# Patient Record
Sex: Female | Born: 1935 | Race: White | Hispanic: No | Marital: Married | Smoking: Never smoker
Health system: Southern US, Community
[De-identification: ages and names within clinical notes are randomized; demographics above are authoritative.]

## PROBLEM LIST (undated history)

## (undated) DIAGNOSIS — E7800 Pure hypercholesterolemia, unspecified: Secondary | ICD-10-CM

## (undated) DIAGNOSIS — D591 Other autoimmune hemolytic anemias: Principal | ICD-10-CM

## (undated) DIAGNOSIS — D649 Anemia, unspecified: Secondary | ICD-10-CM

## (undated) DIAGNOSIS — F0390 Unspecified dementia without behavioral disturbance: Secondary | ICD-10-CM

## (undated) DIAGNOSIS — D4620 Refractory anemia with excess of blasts, unspecified: Principal | ICD-10-CM

## (undated) DIAGNOSIS — S72001A Fracture of unspecified part of neck of right femur, initial encounter for closed fracture: Secondary | ICD-10-CM

## (undated) DIAGNOSIS — M1990 Unspecified osteoarthritis, unspecified site: Secondary | ICD-10-CM

## (undated) DIAGNOSIS — J189 Pneumonia, unspecified organism: Secondary | ICD-10-CM

## (undated) DIAGNOSIS — Z8489 Family history of other specified conditions: Secondary | ICD-10-CM

## (undated) DIAGNOSIS — R011 Cardiac murmur, unspecified: Secondary | ICD-10-CM

## (undated) DIAGNOSIS — R112 Nausea with vomiting, unspecified: Secondary | ICD-10-CM

## (undated) DIAGNOSIS — C44300 Unspecified malignant neoplasm of skin of unspecified part of face: Secondary | ICD-10-CM

## (undated) DIAGNOSIS — Z9889 Other specified postprocedural states: Secondary | ICD-10-CM

## (undated) DIAGNOSIS — Z9289 Personal history of other medical treatment: Secondary | ICD-10-CM

## (undated) DIAGNOSIS — N200 Calculus of kidney: Secondary | ICD-10-CM

## (undated) HISTORY — DX: Other autoimmune hemolytic anemias: D59.1

## (undated) HISTORY — DX: Refractory anemia with excess of blasts, unspecified: D46.20

## (undated) HISTORY — PX: VAGINAL HYSTERECTOMY: SUR661

## (undated) HISTORY — PX: KIDNEY STONE SURGERY: SHX686

## (undated) HISTORY — PX: TONSILLECTOMY: SUR1361

---

## 1998-05-24 ENCOUNTER — Other Ambulatory Visit: Admission: RE | Admit: 1998-05-24 | Discharge: 1998-05-24 | Payer: Self-pay | Admitting: *Deleted

## 1999-05-22 ENCOUNTER — Other Ambulatory Visit: Admission: RE | Admit: 1999-05-22 | Discharge: 1999-05-22 | Payer: Self-pay | Admitting: *Deleted

## 2000-04-09 ENCOUNTER — Other Ambulatory Visit: Admission: RE | Admit: 2000-04-09 | Discharge: 2000-04-09 | Payer: Self-pay | Admitting: *Deleted

## 2001-06-27 ENCOUNTER — Other Ambulatory Visit: Admission: RE | Admit: 2001-06-27 | Discharge: 2001-06-27 | Payer: Self-pay | Admitting: *Deleted

## 2001-08-04 ENCOUNTER — Encounter: Payer: Self-pay | Admitting: *Deleted

## 2001-08-04 ENCOUNTER — Ambulatory Visit (HOSPITAL_COMMUNITY): Admission: RE | Admit: 2001-08-04 | Discharge: 2001-08-04 | Payer: Self-pay | Admitting: *Deleted

## 2002-08-07 ENCOUNTER — Encounter: Admission: RE | Admit: 2002-08-07 | Discharge: 2002-08-07 | Payer: Self-pay

## 2002-08-08 ENCOUNTER — Ambulatory Visit (HOSPITAL_COMMUNITY): Admission: RE | Admit: 2002-08-08 | Discharge: 2002-08-08 | Payer: Self-pay | Admitting: *Deleted

## 2002-08-08 ENCOUNTER — Encounter: Payer: Self-pay | Admitting: Family Medicine

## 2003-05-11 ENCOUNTER — Ambulatory Visit: Admission: RE | Admit: 2003-05-11 | Discharge: 2003-05-11 | Payer: Self-pay | Admitting: Family Medicine

## 2003-05-11 ENCOUNTER — Encounter: Payer: Self-pay | Admitting: Cardiology

## 2003-09-03 ENCOUNTER — Ambulatory Visit (HOSPITAL_COMMUNITY): Admission: RE | Admit: 2003-09-03 | Discharge: 2003-09-03 | Payer: Self-pay | Admitting: *Deleted

## 2003-09-03 ENCOUNTER — Encounter: Payer: Self-pay | Admitting: *Deleted

## 2004-05-20 ENCOUNTER — Other Ambulatory Visit: Admission: RE | Admit: 2004-05-20 | Discharge: 2004-05-20 | Payer: Self-pay | Admitting: *Deleted

## 2004-09-03 ENCOUNTER — Ambulatory Visit (HOSPITAL_COMMUNITY): Admission: RE | Admit: 2004-09-03 | Discharge: 2004-09-03 | Payer: Self-pay | Admitting: Internal Medicine

## 2005-09-10 ENCOUNTER — Ambulatory Visit (HOSPITAL_COMMUNITY): Admission: RE | Admit: 2005-09-10 | Discharge: 2005-09-10 | Payer: Self-pay | Admitting: Family Medicine

## 2006-06-17 ENCOUNTER — Other Ambulatory Visit: Admission: RE | Admit: 2006-06-17 | Discharge: 2006-06-17 | Payer: Self-pay | Admitting: *Deleted

## 2006-11-18 ENCOUNTER — Ambulatory Visit (HOSPITAL_COMMUNITY): Admission: RE | Admit: 2006-11-18 | Discharge: 2006-11-18 | Payer: Self-pay | Admitting: Family Medicine

## 2006-11-30 ENCOUNTER — Encounter: Admission: RE | Admit: 2006-11-30 | Discharge: 2006-11-30 | Payer: Self-pay | Admitting: Family Medicine

## 2007-07-20 ENCOUNTER — Other Ambulatory Visit: Admission: RE | Admit: 2007-07-20 | Discharge: 2007-07-20 | Payer: Self-pay | Admitting: *Deleted

## 2007-08-10 ENCOUNTER — Ambulatory Visit: Payer: Self-pay | Admitting: Gastroenterology

## 2007-08-18 ENCOUNTER — Encounter: Payer: Self-pay | Admitting: Gastroenterology

## 2007-08-18 ENCOUNTER — Ambulatory Visit: Payer: Self-pay | Admitting: Gastroenterology

## 2007-09-08 ENCOUNTER — Ambulatory Visit (HOSPITAL_COMMUNITY): Admission: RE | Admit: 2007-09-08 | Discharge: 2007-09-08 | Payer: Self-pay | Admitting: Gastroenterology

## 2007-09-16 ENCOUNTER — Ambulatory Visit: Payer: Self-pay | Admitting: Gastroenterology

## 2008-01-23 ENCOUNTER — Encounter: Admission: RE | Admit: 2008-01-23 | Discharge: 2008-01-23 | Payer: Self-pay | Admitting: Family Medicine

## 2008-07-26 ENCOUNTER — Other Ambulatory Visit: Admission: RE | Admit: 2008-07-26 | Discharge: 2008-07-26 | Payer: Self-pay | Admitting: Gynecology

## 2008-09-10 ENCOUNTER — Telehealth: Payer: Self-pay | Admitting: Gastroenterology

## 2010-06-02 ENCOUNTER — Ambulatory Visit: Payer: Self-pay | Admitting: Hematology & Oncology

## 2010-06-06 ENCOUNTER — Ambulatory Visit: Payer: Self-pay | Admitting: Internal Medicine

## 2010-06-06 LAB — RETICULOCYTES (CHCC): ABS Retic: 103.6 10*3/uL (ref 19.0–186.0)

## 2010-06-06 LAB — CBC WITH DIFFERENTIAL (CANCER CENTER ONLY)
BASO#: 0 10*3/uL (ref 0.0–0.2)
BASO%: 0.2 % (ref 0.0–2.0)
HCT: 26.7 % — ABNORMAL LOW (ref 34.8–46.6)
HGB: 9.2 g/dL — ABNORMAL LOW (ref 11.6–15.9)
LYMPH#: 0.7 10*3/uL — ABNORMAL LOW (ref 0.9–3.3)
MONO#: 0.2 10*3/uL (ref 0.1–0.9)
NEUT%: 78.5 % (ref 39.6–80.0)
WBC: 4.5 10*3/uL (ref 3.9–10.0)

## 2010-06-10 LAB — COMPREHENSIVE METABOLIC PANEL
ALT: 10 U/L (ref 0–35)
BUN: 14 mg/dL (ref 6–23)
CO2: 24 mEq/L (ref 19–32)
Calcium: 9.2 mg/dL (ref 8.4–10.5)
Chloride: 106 mEq/L (ref 96–112)
Creatinine, Ser: 0.54 mg/dL (ref 0.40–1.20)

## 2010-06-10 LAB — PROTEIN ELECTROPHORESIS, SERUM
Albumin ELP: 61.2 % (ref 55.8–66.1)
Alpha-1-Globulin: 3.9 % (ref 2.9–4.9)
Beta 2: 4.6 % (ref 3.2–6.5)
Beta Globulin: 5.2 % (ref 4.7–7.2)

## 2010-06-10 LAB — VITAMIN B12: Vitamin B-12: 1779 pg/mL — ABNORMAL HIGH (ref 211–911)

## 2010-06-10 LAB — ERYTHROPOIETIN: Erythropoietin: 67.3 m[IU]/mL — ABNORMAL HIGH (ref 2.6–34.0)

## 2010-06-10 LAB — LACTATE DEHYDROGENASE: LDH: 198 U/L (ref 94–250)

## 2010-06-10 LAB — IRON AND TIBC
%SAT: 18 % — ABNORMAL LOW (ref 20–55)
TIBC: 288 ug/dL (ref 250–470)

## 2010-07-08 ENCOUNTER — Ambulatory Visit: Payer: Self-pay | Admitting: Hematology & Oncology

## 2010-07-09 LAB — CBC WITH DIFFERENTIAL (CANCER CENTER ONLY)
Eosinophils Absolute: 0.1 10*3/uL (ref 0.0–0.5)
HCT: 27.4 % — ABNORMAL LOW (ref 34.8–46.6)
LYMPH%: 11.4 % — ABNORMAL LOW (ref 14.0–48.0)
MCV: 93 fL (ref 81–101)
MONO#: 0.3 10*3/uL (ref 0.1–0.9)
NEUT%: 80.5 % — ABNORMAL HIGH (ref 39.6–80.0)
RBC: 2.94 10*6/uL — ABNORMAL LOW (ref 3.70–5.32)
RDW: 12 % (ref 10.5–14.6)
WBC: 5.1 10*3/uL (ref 3.9–10.0)

## 2010-07-09 LAB — RETICULOCYTES (CHCC): ABS Retic: 117.3 10*3/uL (ref 19.0–186.0)

## 2010-08-18 ENCOUNTER — Ambulatory Visit: Payer: Self-pay | Admitting: Hematology & Oncology

## 2010-08-20 LAB — CBC WITH DIFFERENTIAL (CANCER CENTER ONLY)
BASO%: 0.4 % (ref 0.0–2.0)
LYMPH%: 14.1 % (ref 14.0–48.0)
MCH: 31.5 pg (ref 26.0–34.0)
MCV: 93 fL (ref 81–101)
MONO%: 5.8 % (ref 0.0–13.0)
Platelets: 421 10*3/uL — ABNORMAL HIGH (ref 145–400)
RDW: 12.3 % (ref 10.5–14.6)

## 2010-08-20 LAB — IRON AND TIBC: UIBC: 256 ug/dL

## 2010-08-20 LAB — RETICULOCYTES (CHCC)
RBC.: 3.01 MIL/uL — ABNORMAL LOW (ref 3.87–5.11)
Retic Ct Pct: 3.2 % — ABNORMAL HIGH (ref 0.4–3.1)

## 2010-08-20 LAB — CHCC SATELLITE - SMEAR

## 2010-09-10 LAB — CBC WITH DIFFERENTIAL (CANCER CENTER ONLY)
BASO#: 0 10*3/uL (ref 0.0–0.2)
Eosinophils Absolute: 0.1 10*3/uL (ref 0.0–0.5)
HGB: 9.8 g/dL — ABNORMAL LOW (ref 11.6–15.9)
LYMPH%: 13.9 % — ABNORMAL LOW (ref 14.0–48.0)
MCV: 96 fL (ref 81–101)
MONO#: 0.2 10*3/uL (ref 0.1–0.9)
Platelets: 403 10*3/uL — ABNORMAL HIGH (ref 145–400)
RBC: 3.07 10*6/uL — ABNORMAL LOW (ref 3.70–5.32)
WBC: 4.8 10*3/uL (ref 3.9–10.0)

## 2010-09-30 ENCOUNTER — Ambulatory Visit: Payer: Self-pay | Admitting: Hematology & Oncology

## 2010-10-01 LAB — CBC WITH DIFFERENTIAL (CANCER CENTER ONLY)
BASO#: 0 10*3/uL (ref 0.0–0.2)
EOS%: 2.3 % (ref 0.0–7.0)
HCT: 30 % — ABNORMAL LOW (ref 34.8–46.6)
HGB: 10.2 g/dL — ABNORMAL LOW (ref 11.6–15.9)
LYMPH%: 14.7 % (ref 14.0–48.0)
MCH: 32.6 pg (ref 26.0–34.0)
MCHC: 34 g/dL (ref 32.0–36.0)
MONO%: 5.5 % (ref 0.0–13.0)
NEUT%: 77.1 % (ref 39.6–80.0)

## 2010-10-22 LAB — CBC WITH DIFFERENTIAL (CANCER CENTER ONLY)
BASO#: 0 10*3/uL (ref 0.0–0.2)
EOS%: 1.9 % (ref 0.0–7.0)
HCT: 31.9 % — ABNORMAL LOW (ref 34.8–46.6)
HGB: 10.7 g/dL — ABNORMAL LOW (ref 11.6–15.9)
LYMPH#: 0.6 10*3/uL — ABNORMAL LOW (ref 0.9–3.3)
MCHC: 33.7 g/dL (ref 32.0–36.0)
NEUT%: 79.6 % (ref 39.6–80.0)

## 2010-10-22 LAB — IRON AND TIBC
Iron: 41 ug/dL — ABNORMAL LOW (ref 42–145)
TIBC: 289 ug/dL (ref 250–470)
UIBC: 248 ug/dL

## 2010-10-22 LAB — RETICULOCYTES (CHCC): ABS Retic: 53.4 10*3/uL (ref 19.0–186.0)

## 2010-10-22 LAB — FERRITIN: Ferritin: 675 ng/mL — ABNORMAL HIGH (ref 10–291)

## 2010-11-11 ENCOUNTER — Ambulatory Visit: Payer: Self-pay | Admitting: Hematology & Oncology

## 2010-11-12 LAB — CBC WITH DIFFERENTIAL (CANCER CENTER ONLY)
BASO#: 0 10*3/uL (ref 0.0–0.2)
HCT: 31.1 % — ABNORMAL LOW (ref 34.8–46.6)
HGB: 10.6 g/dL — ABNORMAL LOW (ref 11.6–15.9)
LYMPH#: 0.7 10*3/uL — ABNORMAL LOW (ref 0.9–3.3)
LYMPH%: 16.5 % (ref 14.0–48.0)
MCHC: 34 g/dL (ref 32.0–36.0)
MCV: 94 fL (ref 81–101)
MONO#: 0.2 10*3/uL (ref 0.1–0.9)
NEUT%: 75.9 % (ref 39.6–80.0)
WBC: 4.3 10*3/uL (ref 3.9–10.0)

## 2010-12-03 LAB — CBC WITH DIFFERENTIAL (CANCER CENTER ONLY)
BASO#: 0 10*3/uL (ref 0.0–0.2)
BASO%: 0.5 % (ref 0.0–2.0)
EOS%: 1.9 % (ref 0.0–7.0)
Eosinophils Absolute: 0.1 10*3/uL (ref 0.0–0.5)
HCT: 30.7 % — ABNORMAL LOW (ref 34.8–46.6)
HGB: 10.5 g/dL — ABNORMAL LOW (ref 11.6–15.9)
LYMPH#: 0.7 10*3/uL — ABNORMAL LOW (ref 0.9–3.3)
LYMPH%: 15.7 % (ref 14.0–48.0)
MCH: 32.3 pg (ref 26.0–34.0)
MCHC: 34.3 g/dL (ref 32.0–36.0)
MCV: 94 fL (ref 81–101)
MONO#: 0.2 10*3/uL (ref 0.1–0.9)
MONO%: 5.5 % (ref 0.0–13.0)
NEUT#: 3.3 10*3/uL (ref 1.5–6.5)
NEUT%: 76.4 % (ref 39.6–80.0)
Platelets: 352 10*3/uL (ref 145–400)
RBC: 3.26 10*6/uL — ABNORMAL LOW (ref 3.70–5.32)
RDW: 12.3 % (ref 10.5–14.6)
WBC: 4.3 10*3/uL (ref 3.9–10.0)

## 2010-12-03 LAB — CHCC SATELLITE - SMEAR

## 2010-12-03 LAB — IRON AND TIBC
%SAT: 12 % — ABNORMAL LOW (ref 20–55)
Iron: 33 ug/dL — ABNORMAL LOW (ref 42–145)
TIBC: 286 ug/dL (ref 250–470)
UIBC: 253 ug/dL

## 2010-12-03 LAB — RETICULOCYTES (CHCC)
ABS Retic: 72.7 10*3/uL (ref 19.0–186.0)
RBC.: 3.16 MIL/uL — ABNORMAL LOW (ref 3.87–5.11)
Retic Ct Pct: 2.3 % (ref 0.4–3.1)

## 2010-12-03 LAB — FERRITIN: Ferritin: 617 ng/mL — ABNORMAL HIGH (ref 10–291)

## 2010-12-07 ENCOUNTER — Encounter: Payer: Self-pay | Admitting: Family Medicine

## 2010-12-11 ENCOUNTER — Ambulatory Visit: Payer: Self-pay | Admitting: Hematology & Oncology

## 2011-01-07 ENCOUNTER — Other Ambulatory Visit: Payer: Self-pay | Admitting: Hematology & Oncology

## 2011-01-07 ENCOUNTER — Encounter (HOSPITAL_BASED_OUTPATIENT_CLINIC_OR_DEPARTMENT_OTHER): Payer: Medicare Other | Admitting: Hematology & Oncology

## 2011-01-07 LAB — CBC WITH DIFFERENTIAL (CANCER CENTER ONLY)
BASO#: 0 10*3/uL (ref 0.0–0.2)
EOS%: 1.3 % (ref 0.0–7.0)
MCH: 32.8 pg (ref 26.0–34.0)
MCHC: 35 g/dL (ref 32.0–36.0)
MONO%: 5.1 % (ref 0.0–13.0)
NEUT#: 3.9 10*3/uL (ref 1.5–6.5)
Platelets: 355 10*3/uL (ref 145–400)
RDW: 11.6 % (ref 10.5–14.6)

## 2011-01-07 LAB — RETICULOCYTES (CHCC): Retic Ct Pct: 2.2 % (ref 0.4–3.1)

## 2011-01-07 LAB — CHCC SATELLITE - SMEAR

## 2011-01-07 LAB — IRON AND TIBC
%SAT: 18 % — ABNORMAL LOW (ref 20–55)
Iron: 51 ug/dL (ref 42–145)

## 2011-02-04 ENCOUNTER — Other Ambulatory Visit: Payer: Self-pay | Admitting: Hematology & Oncology

## 2011-02-04 ENCOUNTER — Encounter (HOSPITAL_BASED_OUTPATIENT_CLINIC_OR_DEPARTMENT_OTHER): Payer: Medicare Other | Admitting: Hematology & Oncology

## 2011-02-04 DIAGNOSIS — D4620 Refractory anemia with excess of blasts, unspecified: Secondary | ICD-10-CM

## 2011-02-04 LAB — CBC WITH DIFFERENTIAL (CANCER CENTER ONLY)
BASO#: 0.1 10*3/uL (ref 0.0–0.2)
EOS%: 1.3 % (ref 0.0–7.0)
Eosinophils Absolute: 0.1 10*3/uL (ref 0.0–0.5)
HGB: 9.9 g/dL — ABNORMAL LOW (ref 11.6–15.9)
LYMPH%: 16 % (ref 14.0–48.0)
MCH: 34.9 pg — ABNORMAL HIGH (ref 26.0–34.0)
MCHC: 34.9 g/dL (ref 32.0–36.0)
MCV: 100 fL (ref 81–101)
MONO%: 7.1 % (ref 0.0–13.0)
NEUT#: 3.4 10*3/uL (ref 1.5–6.5)
NEUT%: 74.1 % (ref 39.6–80.0)

## 2011-02-04 LAB — IRON AND TIBC
Iron: 49 ug/dL (ref 42–145)
UIBC: 216 ug/dL

## 2011-02-04 LAB — FERRITIN: Ferritin: 809 ng/mL — ABNORMAL HIGH (ref 10–291)

## 2011-02-04 LAB — VITAMIN B12: Vitamin B-12: 198 pg/mL — ABNORMAL LOW (ref 211–911)

## 2011-02-04 LAB — RETICULOCYTES (CHCC): RBC.: 2.79 MIL/uL — ABNORMAL LOW (ref 3.87–5.11)

## 2011-03-04 ENCOUNTER — Other Ambulatory Visit: Payer: Self-pay | Admitting: Hematology & Oncology

## 2011-03-04 ENCOUNTER — Encounter (HOSPITAL_BASED_OUTPATIENT_CLINIC_OR_DEPARTMENT_OTHER): Payer: Medicare Other | Admitting: Hematology & Oncology

## 2011-03-04 DIAGNOSIS — D4620 Refractory anemia with excess of blasts, unspecified: Secondary | ICD-10-CM

## 2011-03-04 LAB — CBC WITH DIFFERENTIAL (CANCER CENTER ONLY)
EOS%: 1.3 % (ref 0.0–7.0)
LYMPH%: 12.9 % — ABNORMAL LOW (ref 14.0–48.0)
MCH: 36.2 pg — ABNORMAL HIGH (ref 26.0–34.0)
MCHC: 35 g/dL (ref 32.0–36.0)
MCV: 103 fL — ABNORMAL HIGH (ref 81–101)
MONO%: 7.2 % (ref 0.0–13.0)
NEUT#: 2.9 10*3/uL (ref 1.5–6.5)
Platelets: 304 10*3/uL (ref 145–400)
RBC: 2.68 10*6/uL — ABNORMAL LOW (ref 3.70–5.32)
RDW: 18.5 % — ABNORMAL HIGH (ref 11.1–15.7)

## 2011-03-04 LAB — CHCC SATELLITE - SMEAR

## 2011-03-04 LAB — IRON AND TIBC
%SAT: 13 % — ABNORMAL LOW (ref 20–55)
TIBC: 260 ug/dL (ref 250–470)

## 2011-03-04 LAB — RETICULOCYTES (CHCC): ABS Retic: 72.3 10*3/uL (ref 19.0–186.0)

## 2011-03-04 LAB — FERRITIN: Ferritin: 812 ng/mL — ABNORMAL HIGH (ref 10–291)

## 2011-03-30 ENCOUNTER — Encounter (HOSPITAL_BASED_OUTPATIENT_CLINIC_OR_DEPARTMENT_OTHER): Payer: Medicare Other | Admitting: Hematology & Oncology

## 2011-03-30 ENCOUNTER — Other Ambulatory Visit: Payer: Self-pay | Admitting: Hematology & Oncology

## 2011-03-30 DIAGNOSIS — D4620 Refractory anemia with excess of blasts, unspecified: Secondary | ICD-10-CM

## 2011-03-30 LAB — RETICULOCYTES (CHCC)
ABS Retic: 126.3 10*3/uL (ref 19.0–186.0)
RBC.: 2.87 MIL/uL — ABNORMAL LOW (ref 3.87–5.11)
Retic Ct Pct: 4.4 % — ABNORMAL HIGH (ref 0.4–3.1)

## 2011-03-30 LAB — CHCC SATELLITE - SMEAR

## 2011-03-30 LAB — CBC WITH DIFFERENTIAL (CANCER CENTER ONLY)
BASO#: 0 10*3/uL (ref 0.0–0.2)
Eosinophils Absolute: 0.1 10*3/uL (ref 0.0–0.5)
HGB: 10 g/dL — ABNORMAL LOW (ref 11.6–15.9)
LYMPH#: 0.5 10*3/uL — ABNORMAL LOW (ref 0.9–3.3)
MONO%: 5.1 % (ref 0.0–13.0)
NEUT#: 4.4 10*3/uL (ref 1.5–6.5)
Platelets: 316 10*3/uL (ref 145–400)
RBC: 2.74 10*6/uL — ABNORMAL LOW (ref 3.70–5.32)
WBC: 5.3 10*3/uL (ref 3.9–10.0)

## 2011-03-30 LAB — VITAMIN B12: Vitamin B-12: 163 pg/mL — ABNORMAL LOW (ref 211–911)

## 2011-06-12 ENCOUNTER — Other Ambulatory Visit: Payer: Self-pay | Admitting: Hematology & Oncology

## 2011-06-12 ENCOUNTER — Encounter (HOSPITAL_BASED_OUTPATIENT_CLINIC_OR_DEPARTMENT_OTHER): Payer: Medicare Other | Admitting: Hematology & Oncology

## 2011-06-12 DIAGNOSIS — D4620 Refractory anemia with excess of blasts, unspecified: Secondary | ICD-10-CM

## 2011-06-12 DIAGNOSIS — D509 Iron deficiency anemia, unspecified: Secondary | ICD-10-CM

## 2011-06-12 LAB — CBC WITH DIFFERENTIAL (CANCER CENTER ONLY)
BASO#: 0 10*3/uL (ref 0.0–0.2)
HCT: 27.2 % — ABNORMAL LOW (ref 34.8–46.6)
HGB: 10 g/dL — ABNORMAL LOW (ref 11.6–15.9)
LYMPH#: 0.5 10*3/uL — ABNORMAL LOW (ref 0.9–3.3)
MONO#: 0.3 10*3/uL (ref 0.1–0.9)
NEUT#: 3.3 10*3/uL (ref 1.5–6.5)
NEUT%: 79.1 % (ref 39.6–80.0)
RBC: 2.58 10*6/uL — ABNORMAL LOW (ref 3.70–5.32)
WBC: 4.2 10*3/uL (ref 3.9–10.0)

## 2011-06-12 LAB — IRON AND TIBC
%SAT: 15 % — ABNORMAL LOW (ref 20–55)
TIBC: 278 ug/dL (ref 250–470)
UIBC: 236 ug/dL

## 2011-06-12 LAB — RETICULOCYTES (CHCC)
RBC.: 2.92 MIL/uL — ABNORMAL LOW (ref 3.87–5.11)
Retic Ct Pct: 4.1 % — ABNORMAL HIGH (ref 0.4–2.3)

## 2011-10-06 ENCOUNTER — Telehealth: Payer: Self-pay | Admitting: *Deleted

## 2011-10-06 NOTE — Telephone Encounter (Signed)
Left pt message to call and schedule appointment was referred for follow up from Dr. Jeannetta Nap

## 2011-10-06 NOTE — Telephone Encounter (Signed)
Nettie Elm called from Dr. Jeannetta Nap wants [place] to schedule pt she is aware

## 2011-10-07 ENCOUNTER — Other Ambulatory Visit: Payer: Self-pay

## 2011-10-07 ENCOUNTER — Telehealth: Payer: Self-pay | Admitting: *Deleted

## 2011-10-07 NOTE — Telephone Encounter (Signed)
Pt made 11-23 appointment °

## 2011-10-09 ENCOUNTER — Ambulatory Visit (HOSPITAL_BASED_OUTPATIENT_CLINIC_OR_DEPARTMENT_OTHER): Payer: Medicare Other | Admitting: Hematology & Oncology

## 2011-10-09 ENCOUNTER — Other Ambulatory Visit: Payer: Self-pay | Admitting: Hematology & Oncology

## 2011-10-09 ENCOUNTER — Encounter: Payer: Self-pay | Admitting: Hematology & Oncology

## 2011-10-09 ENCOUNTER — Other Ambulatory Visit: Payer: Self-pay | Admitting: Oncology

## 2011-10-09 ENCOUNTER — Other Ambulatory Visit (HOSPITAL_BASED_OUTPATIENT_CLINIC_OR_DEPARTMENT_OTHER): Payer: Medicare Other | Admitting: Lab

## 2011-10-09 VITALS — BP 117/73 | HR 77 | Temp 96.6°F | Ht 62.75 in | Wt 117.0 lb

## 2011-10-09 DIAGNOSIS — D509 Iron deficiency anemia, unspecified: Secondary | ICD-10-CM

## 2011-10-09 DIAGNOSIS — D46Z Other myelodysplastic syndromes: Secondary | ICD-10-CM | POA: Insufficient documentation

## 2011-10-09 DIAGNOSIS — D539 Nutritional anemia, unspecified: Secondary | ICD-10-CM

## 2011-10-09 DIAGNOSIS — D4620 Refractory anemia with excess of blasts, unspecified: Secondary | ICD-10-CM

## 2011-10-09 HISTORY — DX: Refractory anemia with excess of blasts, unspecified: D46.20

## 2011-10-09 HISTORY — DX: Other myelodysplastic syndromes: D46.Z

## 2011-10-09 LAB — RETICULOCYTES (CHCC)
ABS Retic: 182.5 10*3/uL (ref 19.0–186.0)
ABS Retic: 182.5 10*3/uL (ref 19.0–186.0)
RBC.: 2.57 MIL/uL — ABNORMAL LOW (ref 3.87–5.11)

## 2011-10-09 LAB — IRON AND TIBC
%SAT: 18 % — ABNORMAL LOW (ref 20–55)
Iron: 53 ug/dL (ref 42–145)
UIBC: 243 ug/dL (ref 125–400)

## 2011-10-09 LAB — COMPREHENSIVE METABOLIC PANEL
Alkaline Phosphatase: 79 U/L (ref 39–117)
BUN: 15 mg/dL (ref 6–23)
CO2: 25 mEq/L (ref 19–32)
Glucose, Bld: 116 mg/dL — ABNORMAL HIGH (ref 70–99)
Total Bilirubin: 0.9 mg/dL (ref 0.3–1.2)
Total Protein: 7.4 g/dL (ref 6.0–8.3)

## 2011-10-09 LAB — LACTATE DEHYDROGENASE: LDH: 267 U/L — ABNORMAL HIGH (ref 94–250)

## 2011-10-09 LAB — CBC WITH DIFFERENTIAL (CANCER CENTER ONLY)
BASO#: 0 10*3/uL (ref 0.0–0.2)
Eosinophils Absolute: 0.1 10*3/uL (ref 0.0–0.5)
HGB: 8.9 g/dL — ABNORMAL LOW (ref 11.6–15.9)
LYMPH#: 0.5 10*3/uL — ABNORMAL LOW (ref 0.9–3.3)
MCH: 39.6 pg — ABNORMAL HIGH (ref 26.0–34.0)
MONO#: 0.2 10*3/uL (ref 0.1–0.9)
MONO%: 4.4 % (ref 0.0–13.0)
NEUT#: 3.7 10*3/uL (ref 1.5–6.5)
Platelets: 312 10*3/uL (ref 145–400)
RBC: 2.25 10*6/uL — ABNORMAL LOW (ref 3.70–5.32)
WBC: 4.5 10*3/uL (ref 3.9–10.0)

## 2011-10-09 LAB — CHCC SATELLITE - SMEAR

## 2011-10-09 LAB — FERRITIN: Ferritin: 1000 ng/mL — ABNORMAL HIGH (ref 10–291)

## 2011-10-09 MED ORDER — DARBEPOETIN ALFA-POLYSORBATE 500 MCG/ML IJ SOLN
300.0000 ug | Freq: Once | INTRAMUSCULAR | Status: AC
  Administered 2011-10-09: 300 ug via SUBCUTANEOUS

## 2011-10-09 NOTE — Progress Notes (Signed)
Addended by: Mirian Capuchin on: 10/09/2011 12:23 PM   Modules accepted: Orders

## 2011-10-09 NOTE — Progress Notes (Signed)
This office note has been dictated.

## 2011-10-09 NOTE — Progress Notes (Signed)
CC:   Aida Puffer, MD  DIAGNOSIS:  Macrocytic anemia - likely myelodysplasia.  CURRENT THERAPY:  Observation.  INTERIM HISTORY:  Nicole Harvey comes in for a long awaited followup. We last saw her in July.  She has gotten a little bit worse.  She is more tired.  She is getting B12 I think at Dr. Fredirick Maudlin office.  We last gave her I think Aranesp back in the summertime.  She has never wanted a bone marrow test done.  I told her it is likely that some of that needs to be done to figure out what exactly is going on with her.  Of note, we also have given her iron in the past.  She occasionally has low iron saturations.  I felt that the iron would be of benefit for her.  She has noticed some paleness to her skin.  She has also felt a little more tired.  Her husband has progressive Alzheimer's.  This is becoming more of an issue for her.  She had a good Thanksgiving.  She did have quite a bit to eat.  PHYSICAL EXAMINATION:  General:  This is a petite, elderly white female in no obvious distress.  Vital signs:  96.6, pulse 77, respiratory rate 20, blood pressure 117/73.  Weight is 117.  Head and neck:  Exam shows a normocephalic, atraumatic skull.  There are no ocular or oral lesions. Lymphs:  There are no palpable cervical or supraclavicular lymph nodes. Lungs:  Clear bilaterally.  Cardiac:  Regular rate and rhythm with normal S1, S2.  There are no murmurs, rubs or bruits.  Abdomen:  Soft with good bowel sounds.  There is no palpable abdominal mass.  There is no fluid wave.  There is no hepatosplenomegaly.  Back:  No tenderness over the spine, ribs or hips.  Extremities:  Show no clubbing, cyanosis or edema.  She has osteoarthritic changes in her joints.  Neurological: No focal neurological deficits.  LABORATORY STUDIES:  White cell count is 4.5, hemoglobin 8.9, hematocrit 24.6, platelet count 312.  MCV is 109.  Peripheral smear shows a slightly macrocytic population of red blood  cells.  I see no nucleated red blood cells.  There is no polychromasia.  I see no schistocytes. There are no target cells.  She has no rouleaux formation.  White cells appear normal in morphology and maturation.  I do not see any immature myeloid or lymphoid forms.  I see no hypersegmented polys.  There are no blasts.  Platelets were adequate number and size.  IMPRESSION:  Nicole Harvey is an 77 year old white female with macrocytic anemia.  I have to believe that she does have myelodysplasia. Again, she has never wanted to have a bone marrow test done.  I think that if we can just get her on a schedule of Aranesp, this would help her.  When we last checked her erythropoietin level, it was only I think 36.  She should respond to Aranesp nicely.  I want to try to get her on a q.3 week schedule of Aranesp.  Again, I think that if we do this we should be able to get her blood count up. We are checking her iron studies.  We may need to give her iron depending on what her iron saturation is.  Her ferritin is on the higher side.  I believe that she has an underlying collagen vascular issue.  She does have an elevated ANA titer.  The sed rate was also quite  elevated.  We will go ahead and put her on Aranesp 300 mcg every 3 weeks.  I want to give her a course of 4 treatments.  Again, if her iron is on the low side we will get her back in for [facility].  It was nice to see Nicole Harvey again.  I feel bad that her poor husband is having a hard time.  I do feel confident that we can get her blood count better.    ______________________________ Josph Macho, M.D. PRE/MEDQ  D:  10/09/2011  T:  10/09/2011  Job:  [id number]

## 2011-10-30 ENCOUNTER — Ambulatory Visit (HOSPITAL_BASED_OUTPATIENT_CLINIC_OR_DEPARTMENT_OTHER): Payer: Medicare Other

## 2011-10-30 ENCOUNTER — Other Ambulatory Visit: Payer: Self-pay | Admitting: Hematology & Oncology

## 2011-10-30 ENCOUNTER — Ambulatory Visit (HOSPITAL_BASED_OUTPATIENT_CLINIC_OR_DEPARTMENT_OTHER): Payer: Medicare Other | Admitting: Lab

## 2011-10-30 VITALS — BP 132/72 | HR 82 | Temp 97.0°F

## 2011-10-30 DIAGNOSIS — D4620 Refractory anemia with excess of blasts, unspecified: Secondary | ICD-10-CM

## 2011-10-30 LAB — CBC WITH DIFFERENTIAL (CANCER CENTER ONLY)
BASO#: 0 10*3/uL (ref 0.0–0.2)
HCT: 25.7 % — ABNORMAL LOW (ref 34.8–46.6)
HGB: 9 g/dL — ABNORMAL LOW (ref 11.6–15.9)
LYMPH#: 0.5 10*3/uL — ABNORMAL LOW (ref 0.9–3.3)
LYMPH%: 8.4 % — ABNORMAL LOW (ref 14.0–48.0)
MCV: 106 fL — ABNORMAL HIGH (ref 81–101)
MONO#: 0.4 10*3/uL (ref 0.1–0.9)
NEUT%: 83.6 % — ABNORMAL HIGH (ref 39.6–80.0)
RDW: 21 % — ABNORMAL HIGH (ref 11.1–15.7)
WBC: 6.3 10*3/uL (ref 3.9–10.0)

## 2011-10-30 MED ORDER — DARBEPOETIN ALFA-POLYSORBATE 500 MCG/ML IJ SOLN
300.0000 ug | Freq: Once | INTRAMUSCULAR | Status: AC
  Administered 2011-10-30: 300 ug via SUBCUTANEOUS
  Filled 2011-10-30: qty 1

## 2011-11-20 ENCOUNTER — Ambulatory Visit (HOSPITAL_BASED_OUTPATIENT_CLINIC_OR_DEPARTMENT_OTHER): Payer: Medicare Other | Admitting: Lab

## 2011-11-20 ENCOUNTER — Ambulatory Visit (HOSPITAL_BASED_OUTPATIENT_CLINIC_OR_DEPARTMENT_OTHER): Payer: Medicare Other

## 2011-11-20 VITALS — BP 109/63 | HR 77 | Temp 97.2°F

## 2011-11-20 DIAGNOSIS — D4620 Refractory anemia with excess of blasts, unspecified: Secondary | ICD-10-CM

## 2011-11-20 DIAGNOSIS — D469 Myelodysplastic syndrome, unspecified: Secondary | ICD-10-CM

## 2011-11-20 LAB — CBC WITH DIFFERENTIAL (CANCER CENTER ONLY)
Eosinophils Absolute: 0 10*3/uL (ref 0.0–0.5)
LYMPH#: 0.6 10*3/uL — ABNORMAL LOW (ref 0.9–3.3)
MCH: 35.1 pg — ABNORMAL HIGH (ref 26.0–34.0)
MONO%: 4.2 % (ref 0.0–13.0)
NEUT#: 4.2 10*3/uL (ref 1.5–6.5)
Platelets: 324 10*3/uL (ref 145–400)
RBC: 2.65 10*6/uL — ABNORMAL LOW (ref 3.70–5.32)
WBC: 5 10*3/uL (ref 3.9–10.0)

## 2011-11-20 MED ORDER — DARBEPOETIN ALFA-POLYSORBATE 500 MCG/ML IJ SOLN
300.0000 ug | Freq: Once | INTRAMUSCULAR | Status: AC
  Administered 2011-11-20: 300 ug via SUBCUTANEOUS
  Filled 2011-11-20: qty 1

## 2011-12-10 ENCOUNTER — Ambulatory Visit (HOSPITAL_BASED_OUTPATIENT_CLINIC_OR_DEPARTMENT_OTHER): Payer: Medicare Other

## 2011-12-10 ENCOUNTER — Other Ambulatory Visit (HOSPITAL_BASED_OUTPATIENT_CLINIC_OR_DEPARTMENT_OTHER): Payer: Medicare Other | Admitting: Lab

## 2011-12-10 ENCOUNTER — Ambulatory Visit (HOSPITAL_BASED_OUTPATIENT_CLINIC_OR_DEPARTMENT_OTHER): Payer: Medicare Other | Admitting: Hematology & Oncology

## 2011-12-10 VITALS — HR 78 | Temp 97.3°F | Ht 62.75 in | Wt 115.0 lb

## 2011-12-10 DIAGNOSIS — D4620 Refractory anemia with excess of blasts, unspecified: Secondary | ICD-10-CM

## 2011-12-10 LAB — CBC WITH DIFFERENTIAL (CANCER CENTER ONLY)
Eosinophils Absolute: 0 10*3/uL (ref 0.0–0.5)
HGB: 10.4 g/dL — ABNORMAL LOW (ref 11.6–15.9)
LYMPH%: 13 % — ABNORMAL LOW (ref 14.0–48.0)
MCV: 102 fL — ABNORMAL HIGH (ref 81–101)
MONO#: 0.2 10*3/uL (ref 0.1–0.9)
NEUT#: 3.8 10*3/uL (ref 1.5–6.5)
Platelets: 286 10*3/uL (ref 145–400)
RBC: 2.93 10*6/uL — ABNORMAL LOW (ref 3.70–5.32)
WBC: 4.8 10*3/uL (ref 3.9–10.0)

## 2011-12-10 LAB — IRON AND TIBC: UIBC: 244 ug/dL (ref 125–400)

## 2011-12-10 MED ORDER — DARBEPOETIN ALFA-POLYSORBATE 300 MCG/0.6ML IJ SOLN
300.0000 ug | Freq: Once | INTRAMUSCULAR | Status: DC
  Administered 2011-12-10: 300 ug via SUBCUTANEOUS

## 2011-12-10 NOTE — Progress Notes (Signed)
This office note has been dictated.

## 2011-12-10 NOTE — Progress Notes (Signed)
CC:   Nicole Harvey. Little, M.D.  DIAGNOSES: 1. Myelodysplastic syndrome, refractory anemia. 2. Pernicious anemia. 3. Intermittent iron deficiency anemia.  CURRENT THERAPY: 1. Aranesp 300 mcg subcu as needed for hemoglobin less than 11. 2. IV iron as indicated. 3. Vitamin B12, 2 mg p.o. daily.  INTERIM HISTORY:  Nicole Harvey comes in for followup.  She is feeling a little bit better.  Her blood count is responding somewhat.  She has a little more energy.  When we last saw her, she had a ferritin of 1000.  She has had issues with low B12.  I feel that she could take B12 over the counter.  I told her to take 2 mg a day of B12.  She has had no bleeding.  There has been no change in bowel or bladder habits.  PHYSICAL EXAM:  General:  This is a well-developed, well-nourished elderly female in no obvious distress.  She is somewhat fatigued.  Vital Signs:  Temperature 97.3, pulse 78, respiratory rate 14, blood pressure 106/62.  Weight is 115.  Head/Neck:  Exam shows a normocephalic, atraumatic skull.  There are no ocular or oral lesions.  There are no palpable cervical or supraclavicular lymph nodes.  Lungs:  Clear to percussion and auscultation bilaterally.  Cardiac:  Regular rate and rhythm with a normal S1 and S2.  There are no murmurs, rubs, or bruits. Abdomen:  Soft with good bowel sounds.  There is no palpable abdominal mass.  No palpable hepatosplenomegaly.  Back:  Exam shows some slight kyphosis.  There is no tenderness over the spine, ribs, or hips. Extremities:  Some osteoarthritic changes in her joints.  She has no swelling in her legs.  Neurologic:  Exam shows no focal neurological deficits.  LABORATORY STUDIES:  White cell count is 4.8, hemoglobin 10.4, hematocrit 36, platelet count 286.  MCV is 102.  IMPRESSION:  Nicole Harvey is an 78 year old white female with refractory anemia.  She also was found to have pernicious anemia by her family doctor.  She gets Aranesp.   With the Aranesp, she does occasionally get iron deficient.  We will go ahead and give her another dose of Aranesp today.  We will check her iron stores.  Her ferritin is quite elevated, mostly I think it is more of an acute phase reactant. We will continue to have her come back every 3 weeks to have her lab work checked.  I will see her back myself in about 6 weeks or so.    ______________________________ Josph Macho, M.D. PRE/MEDQ  D:  12/10/2011  T:  12/10/2011  Job:  [id number]  ADDENDUM:  Ferritin is 918.  %Sat is 15.  Iron is 42.

## 2011-12-21 ENCOUNTER — Telehealth: Payer: Self-pay | Admitting: Hematology & Oncology

## 2011-12-21 ENCOUNTER — Other Ambulatory Visit: Payer: Self-pay | Admitting: *Deleted

## 2011-12-21 DIAGNOSIS — D509 Iron deficiency anemia, unspecified: Secondary | ICD-10-CM

## 2011-12-21 NOTE — Telephone Encounter (Signed)
Pt aware of 2-14 iron and inj time change. Nicole Harvey has note

## 2011-12-31 ENCOUNTER — Ambulatory Visit (HOSPITAL_BASED_OUTPATIENT_CLINIC_OR_DEPARTMENT_OTHER): Payer: Medicare Other

## 2011-12-31 ENCOUNTER — Other Ambulatory Visit: Payer: Medicare Other | Admitting: Lab

## 2011-12-31 ENCOUNTER — Ambulatory Visit: Payer: Medicare Other

## 2011-12-31 ENCOUNTER — Other Ambulatory Visit (HOSPITAL_BASED_OUTPATIENT_CLINIC_OR_DEPARTMENT_OTHER): Payer: Medicare Other | Admitting: Lab

## 2011-12-31 DIAGNOSIS — D4620 Refractory anemia with excess of blasts, unspecified: Secondary | ICD-10-CM

## 2011-12-31 DIAGNOSIS — D509 Iron deficiency anemia, unspecified: Secondary | ICD-10-CM

## 2011-12-31 LAB — CBC WITH DIFFERENTIAL (CANCER CENTER ONLY)
Eosinophils Absolute: 0.1 10*3/uL (ref 0.0–0.5)
HCT: 29 % — ABNORMAL LOW (ref 34.8–46.6)
HGB: 10 g/dL — ABNORMAL LOW (ref 11.6–15.9)
LYMPH%: 12.4 % — ABNORMAL LOW (ref 14.0–48.0)
MCV: 99 fL (ref 81–101)
MONO#: 0.3 10*3/uL (ref 0.1–0.9)
NEUT%: 80.1 % — ABNORMAL HIGH (ref 39.6–80.0)
RBC: 2.94 10*6/uL — ABNORMAL LOW (ref 3.70–5.32)
WBC: 5.1 10*3/uL (ref 3.9–10.0)

## 2011-12-31 MED ORDER — SODIUM CHLORIDE 0.9 % IV SOLN
Freq: Once | INTRAVENOUS | Status: AC
  Administered 2011-12-31: 14:00:00 via INTRAVENOUS

## 2011-12-31 MED ORDER — DARBEPOETIN ALFA-POLYSORBATE 300 MCG/0.6ML IJ SOLN
300.0000 ug | Freq: Once | INTRAMUSCULAR | Status: AC
  Administered 2011-12-31: 300 ug via SUBCUTANEOUS

## 2011-12-31 MED ORDER — METHYLPREDNISOLONE SODIUM SUCC 125 MG IJ SOLR
60.0000 mg | Freq: Once | INTRAMUSCULAR | Status: AC
  Administered 2011-12-31: 125 mg via INTRAVENOUS

## 2011-12-31 MED ORDER — SODIUM CHLORIDE 0.9 % IV SOLN
1020.0000 mg | Freq: Once | INTRAVENOUS | Status: AC
  Administered 2011-12-31: 1020 mg via INTRAVENOUS
  Filled 2011-12-31: qty 34

## 2012-01-21 ENCOUNTER — Ambulatory Visit: Payer: Medicare Other

## 2012-01-21 ENCOUNTER — Other Ambulatory Visit (HOSPITAL_BASED_OUTPATIENT_CLINIC_OR_DEPARTMENT_OTHER): Payer: Medicare Other | Admitting: Lab

## 2012-01-21 ENCOUNTER — Ambulatory Visit (HOSPITAL_BASED_OUTPATIENT_CLINIC_OR_DEPARTMENT_OTHER): Payer: Medicare Other | Admitting: Hematology & Oncology

## 2012-01-21 VITALS — BP 121/80 | HR 87 | Temp 96.6°F | Ht 62.75 in | Wt 112.0 lb

## 2012-01-21 DIAGNOSIS — D4620 Refractory anemia with excess of blasts, unspecified: Secondary | ICD-10-CM

## 2012-01-21 LAB — CBC WITH DIFFERENTIAL (CANCER CENTER ONLY)
BASO#: 0.1 10*3/uL (ref 0.0–0.2)
Eosinophils Absolute: 0 10*3/uL (ref 0.0–0.5)
HCT: 33.2 % — ABNORMAL LOW (ref 34.8–46.6)
HGB: 11.4 g/dL — ABNORMAL LOW (ref 11.6–15.9)
LYMPH%: 9.4 % — ABNORMAL LOW (ref 14.0–48.0)
MCH: 34.9 pg — ABNORMAL HIGH (ref 26.0–34.0)
MCV: 102 fL — ABNORMAL HIGH (ref 81–101)
MONO#: 0.3 10*3/uL (ref 0.1–0.9)
MONO%: 5.8 % (ref 0.0–13.0)
NEUT%: 82.6 % — ABNORMAL HIGH (ref 39.6–80.0)
Platelets: 302 10*3/uL (ref 145–400)
RBC: 3.27 10*6/uL — ABNORMAL LOW (ref 3.70–5.32)
WBC: 4.5 10*3/uL (ref 3.9–10.0)

## 2012-01-21 LAB — RETICULOCYTES (CHCC)
ABS Retic: 70.4 10*3/uL (ref 19.0–186.0)
RBC.: 3.52 MIL/uL — ABNORMAL LOW (ref 3.87–5.11)

## 2012-01-21 NOTE — Progress Notes (Signed)
This office note has been dictated.

## 2012-01-21 NOTE — Progress Notes (Signed)
Patient comes in today, CBC checked, Nicole Harvey injection not given due to parameters.  Hgb was 11.4.  Patient made aware and was seen by Dr. Drue Dun. Teola Bradley, Eliette Drumwright [employer]

## 2012-01-22 NOTE — Progress Notes (Signed)
CC:   Nicole Harvey. Little, M.D. Frederick A. Worthy Rancher, M.D.  DIAGNOSES: 1. Myelodysplastic syndrome, refractory anemia. 2. Pernicious anemia. 3. Intermittent iron deficiency secondary to ESA.  CURRENT THERAPY: 1. Aranesp 300 mcg subcutaneously as needed for hemoglobin less than     11. 2. Vitamin B12, 2 mg p.o. daily. 3. IV iron as indicated.  INTERIM HISTORY:  Nicole Harvey comes in for follow-up.  She is feeling better.  We did go ahead and give her some iron back on February 14th. She got 1020 mg of Feraheme.  She also got Aranesp that day.  We gave her the Aranesp and iron as her iron saturation was only 15%, and her total iron was 42.  Her ferritin was quite high at 919, but again this is an acute phase reactant.  She has had a skin nodule come up on the extensor aspect of her right forearm.  This measures about 2 mm.  It is firm and somewhat tender.  I spoke with Dr. Merlyn Albert Lupton's office for an appointment.  They will see her on March 11th at 2:30 p.m.  Nicole Harvey is eating well.  She is having no problems going to the bathroom.  She is having no cough.  There are no rashes.  There is no leg swelling.  She has had no headache.  PHYSICAL EXAMINATION:  General Appearance:  This is a petite, elderly white female in no obvious distress.  Vital Signs:  Show a temperature of 96.6, pulse 87, respiratory rate 16, blood pressure 121/80.  Weight is 112 pounds.  Head and Neck Exam:  Shows a normocephalic, atraumatic skull.  There are no ocular or oral lesions.  There are no palpable cervical or supraclavicular lymph nodes.  Lungs:  Clear to percussion and auscultation bilaterally.  Cardiac Exam:  Regular rate and rhythm with a normal S1 and S2.  There are no murmurs, rubs or bruits. Abdominal Exam:  Soft with good bowel sounds.  There is no palpable abdominal mass.  There is no palpable hepatosplenomegaly.  Back Exam: Shows no tenderness over the spine, ribs or hips.  Extremities:   Show no clubbing, cyanosis or edema.  She does have some rheumatoid arthritis- type changes in her hands.  Skin Exam:  Does show this firm, slightly tender, nonmobile 2-3 mm nodule on the right forearm.  LABORATORY STUDIES:  White cell count is 4.5, hematocrit 11.4, hematocrit 33.2, platelet count 302.  IMPRESSION:  Nicole Harvey is an 78 year old white female with myelodysplastic syndrome.  She has refractory anemia.  I am glad that she is responding to the Aranesp and iron.  It is hard to say whether "pernicious anemia" is a real issue for her.  She does not need an injection today which I am very happy about.  We will go ahead and plan to get her back in 3 weeks for another CBC check.  I will plan to see her back myself in 6 weeks.  Again, Dr. Terri Piedra will take off this nodule on the right forearm.    ______________________________ Josph Macho, M.D. PRE/MEDQ  D:  01/21/2012  T:  01/22/2012  Job:  [id number]

## 2012-02-10 ENCOUNTER — Ambulatory Visit (HOSPITAL_BASED_OUTPATIENT_CLINIC_OR_DEPARTMENT_OTHER): Payer: Medicare Other

## 2012-02-10 ENCOUNTER — Other Ambulatory Visit (HOSPITAL_BASED_OUTPATIENT_CLINIC_OR_DEPARTMENT_OTHER): Payer: Medicare Other | Admitting: Lab

## 2012-02-10 VITALS — BP 124/72 | HR 77 | Temp 97.0°F

## 2012-02-10 DIAGNOSIS — D509 Iron deficiency anemia, unspecified: Secondary | ICD-10-CM

## 2012-02-10 DIAGNOSIS — D4620 Refractory anemia with excess of blasts, unspecified: Secondary | ICD-10-CM

## 2012-02-10 LAB — CBC WITH DIFFERENTIAL (CANCER CENTER ONLY)
BASO#: 0.1 10*3/uL (ref 0.0–0.2)
Eosinophils Absolute: 0.1 10*3/uL (ref 0.0–0.5)
HGB: 10 g/dL — ABNORMAL LOW (ref 11.6–15.9)
LYMPH#: 0.6 10*3/uL — ABNORMAL LOW (ref 0.9–3.3)
NEUT#: 6.3 10*3/uL (ref 1.5–6.5)
RBC: 2.68 10*6/uL — ABNORMAL LOW (ref 3.70–5.32)

## 2012-02-10 LAB — FERRITIN: Ferritin: 1575 ng/mL — ABNORMAL HIGH (ref 10–291)

## 2012-02-10 LAB — IRON AND TIBC
Iron: 49 ug/dL (ref 42–145)
UIBC: 229 ug/dL (ref 125–400)

## 2012-02-10 MED ORDER — DARBEPOETIN ALFA-POLYSORBATE 300 MCG/0.6ML IJ SOLN
300.0000 ug | Freq: Once | INTRAMUSCULAR | Status: AC
  Administered 2012-02-10: 300 ug via SUBCUTANEOUS

## 2012-02-11 ENCOUNTER — Ambulatory Visit: Payer: Medicare Other

## 2012-02-11 ENCOUNTER — Other Ambulatory Visit: Payer: Medicare Other | Admitting: Lab

## 2012-03-03 ENCOUNTER — Other Ambulatory Visit: Payer: Medicare Other | Admitting: Lab

## 2012-03-03 ENCOUNTER — Ambulatory Visit: Payer: Medicare Other | Admitting: Hematology & Oncology

## 2012-03-03 ENCOUNTER — Ambulatory Visit: Payer: Medicare Other

## 2012-03-24 ENCOUNTER — Ambulatory Visit (HOSPITAL_BASED_OUTPATIENT_CLINIC_OR_DEPARTMENT_OTHER): Payer: Medicare Other | Admitting: Lab

## 2012-03-24 ENCOUNTER — Ambulatory Visit: Payer: Medicare Other

## 2012-03-24 DIAGNOSIS — D4620 Refractory anemia with excess of blasts, unspecified: Secondary | ICD-10-CM

## 2012-03-24 DIAGNOSIS — D509 Iron deficiency anemia, unspecified: Secondary | ICD-10-CM

## 2012-03-24 DIAGNOSIS — D469 Myelodysplastic syndrome, unspecified: Secondary | ICD-10-CM

## 2012-03-24 LAB — CBC WITH DIFFERENTIAL (CANCER CENTER ONLY)
BASO%: 0.9 % (ref 0.0–2.0)
Eosinophils Absolute: 0.1 10*3/uL (ref 0.0–0.5)
LYMPH%: 10.1 % — ABNORMAL LOW (ref 14.0–48.0)
MCH: 35.5 pg — ABNORMAL HIGH (ref 26.0–34.0)
MCV: 104 fL — ABNORMAL HIGH (ref 81–101)
MONO%: 4.3 % (ref 0.0–13.0)
NEUT#: 4.7 10*3/uL (ref 1.5–6.5)
Platelets: 317 10*3/uL (ref 145–400)
RBC: 2.59 10*6/uL — ABNORMAL LOW (ref 3.70–5.32)
RDW: 17.5 % — ABNORMAL HIGH (ref 11.1–15.7)
WBC: 5.6 10*3/uL (ref 3.9–10.0)

## 2012-03-24 MED ORDER — DARBEPOETIN ALFA-POLYSORBATE 300 MCG/0.6ML IJ SOLN
300.0000 ug | Freq: Once | INTRAMUSCULAR | Status: AC
  Administered 2012-03-24: 300 ug via SUBCUTANEOUS

## 2012-03-29 LAB — IRON AND TIBC
Iron: 40 ug/dL — ABNORMAL LOW (ref 42–145)
TIBC: 265 ug/dL (ref 250–470)
UIBC: 225 ug/dL (ref 125–400)

## 2012-03-29 LAB — RETICULOCYTES (CHCC)
RBC.: 2.75 MIL/uL — ABNORMAL LOW (ref 3.87–5.11)
Retic Ct Pct: 6.3 % — ABNORMAL HIGH (ref 0.4–2.3)

## 2012-03-29 LAB — TRANSFERRIN RECEPTOR, SOLUABLE: Transferrin Receptor, Soluble: 4.23 mg/L — ABNORMAL HIGH (ref 0.76–1.76)

## 2012-04-14 ENCOUNTER — Other Ambulatory Visit: Payer: Medicare Other | Admitting: Lab

## 2012-04-14 ENCOUNTER — Ambulatory Visit: Payer: Medicare Other

## 2012-04-14 MED ORDER — DARBEPOETIN ALFA-POLYSORBATE 300 MCG/0.6ML IJ SOLN: 300.0000 ug | Freq: Once | INTRAMUSCULAR | Status: DC

## 2012-04-15 ENCOUNTER — Telehealth: Payer: Self-pay | Admitting: Hematology & Oncology

## 2012-04-15 NOTE — Telephone Encounter (Signed)
Pt did not want to reschedule missed 5-30 lab and inj. Said she canceled off of phone tree, I gave her our number to call us to cancel. She also had concerns about bills again and I told her she needed to call billing, that I couldn't help her with that. She is aware of 6-20 appointment

## 2012-04-22 NOTE — Progress Notes (Signed)
On 11/20/11 there was no appt for tx.

## 2012-05-05 ENCOUNTER — Other Ambulatory Visit (HOSPITAL_BASED_OUTPATIENT_CLINIC_OR_DEPARTMENT_OTHER): Payer: Medicare Other | Admitting: Lab

## 2012-05-05 ENCOUNTER — Ambulatory Visit (HOSPITAL_BASED_OUTPATIENT_CLINIC_OR_DEPARTMENT_OTHER): Payer: Medicare Other

## 2012-05-05 ENCOUNTER — Other Ambulatory Visit: Payer: Self-pay | Admitting: *Deleted

## 2012-05-05 ENCOUNTER — Encounter (HOSPITAL_COMMUNITY)
Admission: RE | Admit: 2012-05-05 | Discharge: 2012-05-05 | Disposition: A | Discharge status: Home / Self Care | Payer: Medicare Other | Source: Ambulatory Visit | Attending: Hematology & Oncology | Admitting: Hematology & Oncology

## 2012-05-05 VITALS — BP 123/64 | HR 82 | Temp 97.5°F

## 2012-05-05 DIAGNOSIS — D4620 Refractory anemia with excess of blasts, unspecified: Secondary | ICD-10-CM

## 2012-05-05 DIAGNOSIS — D649 Anemia, unspecified: Secondary | ICD-10-CM | POA: Insufficient documentation

## 2012-05-05 LAB — CBC WITH DIFFERENTIAL (CANCER CENTER ONLY)
BASO#: 0.1 10*3/uL (ref 0.0–0.2)
EOS%: 1.3 % (ref 0.0–7.0)
HGB: 7.2 g/dL — ABNORMAL LOW (ref 11.6–15.9)
LYMPH#: 0.4 10*3/uL — ABNORMAL LOW (ref 0.9–3.3)
MCHC: 32.9 g/dL (ref 32.0–36.0)
MONO%: 5.2 % (ref 0.0–13.0)
NEUT#: 4.5 10*3/uL (ref 1.5–6.5)
Platelets: 340 10*3/uL (ref 145–400)
RBC: 1.97 10*6/uL — ABNORMAL LOW (ref 3.70–5.32)

## 2012-05-05 LAB — PREPARE RBC (CROSSMATCH)

## 2012-05-05 LAB — ABO/RH: ABO/RH(D): O NEG

## 2012-05-05 LAB — HOLD TUBE, BLOOD BANK - CHCC SATELLITE

## 2012-05-05 MED ORDER — DARBEPOETIN ALFA-POLYSORBATE 300 MCG/0.6ML IJ SOLN
300.0000 ug | Freq: Once | INTRAMUSCULAR | Status: AC
  Administered 2012-05-05: 300 ug via SUBCUTANEOUS

## 2012-05-05 NOTE — Progress Notes (Signed)
Hgb 7.2. Reviewed with Dr Myna Hidalgo. To transfuse with 2 units PRBCs if pt is agreeable. Stevenson Clinch, RN asked the pt & she agreed to come in tomorrow.

## 2012-05-06 ENCOUNTER — Ambulatory Visit (HOSPITAL_BASED_OUTPATIENT_CLINIC_OR_DEPARTMENT_OTHER): Payer: Medicare Other

## 2012-05-06 ENCOUNTER — Encounter: Payer: Self-pay | Admitting: Hematology & Oncology

## 2012-05-06 VITALS — BP 107/74 | HR 85 | Temp 97.0°F | Resp 20

## 2012-05-06 DIAGNOSIS — D4620 Refractory anemia with excess of blasts, unspecified: Secondary | ICD-10-CM

## 2012-05-06 MED ORDER — DIPHENHYDRAMINE HCL 25 MG PO CAPS
25.0000 mg | ORAL_CAPSULE | Freq: Once | ORAL | Status: AC
  Administered 2012-05-06: 25 mg via ORAL

## 2012-05-06 MED ORDER — SODIUM CHLORIDE 0.9 % IV SOLN
250.0000 mL | Freq: Once | INTRAVENOUS | Status: AC
  Administered 2012-05-06: 250 mL via INTRAVENOUS

## 2012-05-06 MED ORDER — FUROSEMIDE 10 MG/ML IJ SOLN
20.0000 mg | Freq: Once | INTRAMUSCULAR | Status: AC
  Administered 2012-05-06: 20 mg via INTRAVENOUS

## 2012-05-06 MED ORDER — ACETAMINOPHEN 325 MG PO TABS
650.0000 mg | ORAL_TABLET | Freq: Once | ORAL | Status: AC
  Administered 2012-05-06: 650 mg via ORAL

## 2012-05-06 NOTE — Patient Instructions (Signed)
Blood Transfusion   A blood transfusion replaces your blood or some of its parts. Blood is replaced when you have lost blood because of surgery, an accident, or for severe blood conditions like anemia.  You can donate blood to be used on yourself if you have a planned surgery. If you lose blood during that surgery, your own blood can be given back to you.  Any blood given to you is checked to make sure it matches your blood type. Your temperature, blood pressure, and heart rate (vital signs) will be checked often.   GET HELP RIGHT AWAY IF:    You feel sick to your stomach (nauseous) or throw up (vomit).   You have watery poop (diarrhea).   You have shortness of breath or trouble breathing.   You have blood in your pee (urine) or have dark colored pee.   You have chest pain or tightness.   Your eyes or skin turn yellow (jaundice).   You have a temperature by mouth above 102 F (38.9 C), not controlled by medicine.   You start to shake and have chills.   You develop a a red rash (hives) or feel itchy.   You develop lightheadedness or feel confused.   You develop back, joint, or muscle pain.   You do not feel hungry (lost appetite).   You feel tired, restless, or nervous.   You develop belly (abdominal) cramps.  Document Released: 01/29/2009 Document Revised: 10/22/2011 Document Reviewed: 01/29/2009  [employer] Patient Information 2012 [employer].

## 2012-05-07 LAB — TYPE AND SCREEN
ABO/RH(D): O NEG
Antibody Screen: NEGATIVE
Unit division: 0

## 2012-06-07 ENCOUNTER — Telehealth: Payer: Self-pay | Admitting: Hematology & Oncology

## 2012-06-07 NOTE — Telephone Encounter (Signed)
Patient called and spoke with RN.  Per RN to sch lab and inj appt.  Patient sch appt for 06/08/12

## 2012-06-08 ENCOUNTER — Ambulatory Visit (HOSPITAL_BASED_OUTPATIENT_CLINIC_OR_DEPARTMENT_OTHER): Payer: Medicare Other

## 2012-06-08 ENCOUNTER — Ambulatory Visit (HOSPITAL_BASED_OUTPATIENT_CLINIC_OR_DEPARTMENT_OTHER): Payer: Medicare Other | Admitting: Hematology & Oncology

## 2012-06-08 ENCOUNTER — Other Ambulatory Visit (HOSPITAL_BASED_OUTPATIENT_CLINIC_OR_DEPARTMENT_OTHER): Payer: Medicare Other | Admitting: Lab

## 2012-06-08 DIAGNOSIS — D509 Iron deficiency anemia, unspecified: Secondary | ICD-10-CM

## 2012-06-08 DIAGNOSIS — D510 Vitamin B12 deficiency anemia due to intrinsic factor deficiency: Secondary | ICD-10-CM

## 2012-06-08 DIAGNOSIS — D4620 Refractory anemia with excess of blasts, unspecified: Secondary | ICD-10-CM

## 2012-06-08 LAB — CBC WITH DIFFERENTIAL (CANCER CENTER ONLY)
BASO%: 0.6 % (ref 0.0–2.0)
EOS%: 1.9 % (ref 0.0–7.0)
LYMPH#: 0.5 10*3/uL — ABNORMAL LOW (ref 0.9–3.3)
MCH: 34.7 pg — ABNORMAL HIGH (ref 26.0–34.0)
MCHC: 32.8 g/dL (ref 32.0–36.0)
MONO%: 5.5 % (ref 0.0–13.0)
NEUT#: 4.3 10*3/uL (ref 1.5–6.5)
RDW: 17.1 % — ABNORMAL HIGH (ref 11.1–15.7)

## 2012-06-08 MED ORDER — DARBEPOETIN ALFA-POLYSORBATE 300 MCG/0.6ML IJ SOLN
300.0000 ug | Freq: Once | INTRAMUSCULAR | Status: AC
  Administered 2012-06-08: 300 ug via SUBCUTANEOUS

## 2012-06-08 MED ORDER — CYANOCOBALAMIN 1000 MCG/ML IJ SOLN
1000.0000 ug | Freq: Once | INTRAMUSCULAR | Status: AC
  Administered 2012-06-08: 1000 ug via INTRAMUSCULAR

## 2012-06-08 MED ORDER — SODIUM CHLORIDE 0.9 % IV SOLN
1020.0000 mg | Freq: Once | INTRAVENOUS | Status: AC
  Administered 2012-06-08: 1020 mg via INTRAVENOUS
  Filled 2012-06-08: qty 34

## 2012-06-08 NOTE — Progress Notes (Signed)
This office note has been dictated.

## 2012-06-09 NOTE — Progress Notes (Signed)
CC:   Nicole Harvey. Little, M.D.  DIAGNOSES: 1. Refractory anemia. 2. Intermittent iron-deficiency anemia. 3. Pernicious anemia.  CURRENT THERAPY: 1. Aranesp 300 mcg subcu as needed for hemoglobin less than 11. 2. IV iron as indicated. 3. Vitamin B12 two milligrams p.o. daily.  INTERIM HISTORY:  Ms. Fenderson comes in.  She actually came in for lab work.  She complained of being very tired.  We last saw her 4 months ago.  She has not been able to make it in because of issues at home.  We last gave her, I think, iron back in February.  She last got Aranesp in June.  She has not had any obvious bleeding.  She just feels tired.  Her husband, I think, has Alzheimer's so this been tough on her.  Of note, she was last transfused in June, at which point her hemoglobin I think was 7.2.  I have tried to get her to do a bone marrow biopsy.  She has been very reluctant to do this.  She has lost some weight, she feels.  PHYSICAL EXAMINATION:  This is a thin white female in no obvious distress.  Vital signs:  98, pulse 80, respiratory rate 18, blood pressure is 110/70.  Her weight was not taken.  Head and neck: Normocephalic, atraumatic skull.  There is no scleral icterus.  There is no adenopathy in her neck.  Thyroid is not palpable.  Lungs:  Clear to percussion and auscultation bilaterally.  Cardiac:  Regular rate and rhythm with a normal S1 and S2.  There are no murmurs, rubs or bruits. Abdomen:  Soft with good bowel sounds.  There is no palpable abdominal mass.  There is no fluid wave.  There is no palpable hepatosplenomegaly. Back:  No tenderness over the spine, ribs, or hips.  Extremities:  No clubbing, cyanosis or edema.  LABORATORY STUDIES:  White cell count is 5.2, hemoglobin 7.6, hematocrit 23.2, platelet count 307.  MCV is 106.  IMPRESSION:  Ms. Devargas is an 78 year old female with refractory anemia.  She also has pernicious anemia.  I do think she is probably going to  need iron today.  Her last iron studies back in May only showed iron saturation of 15% with an iron of 40.  Her ferritin was 1378, again which is an acute phase reactant.  I want to get Ms. Delatte back in a month.  I told her that she would need to come back to see [place] when scheduled instead of waiting 4 months. Hopefully, we can avoid her blood going down as it has.    ______________________________ Josph Macho, M.D. PRE/MEDQ  D:  06/08/2012  T:  06/09/2012  Job:  [id number]

## 2012-07-07 ENCOUNTER — Telehealth: Payer: Self-pay | Admitting: Hematology & Oncology

## 2012-07-07 ENCOUNTER — Other Ambulatory Visit (HOSPITAL_BASED_OUTPATIENT_CLINIC_OR_DEPARTMENT_OTHER): Payer: Medicare Other | Admitting: Lab

## 2012-07-07 ENCOUNTER — Ambulatory Visit: Payer: Medicare Other | Admitting: Hematology & Oncology

## 2012-07-07 ENCOUNTER — Ambulatory Visit: Payer: Medicare Other

## 2012-07-07 NOTE — Telephone Encounter (Signed)
Tried to call pt to reschedule missed appointment from today phone has been disconnected. MD aware

## 2012-07-13 ENCOUNTER — Telehealth: Payer: Self-pay | Admitting: Hematology & Oncology

## 2012-07-13 NOTE — Telephone Encounter (Signed)
Pt aware of 07-19-12 appointment

## 2012-07-19 ENCOUNTER — Ambulatory Visit (HOSPITAL_BASED_OUTPATIENT_CLINIC_OR_DEPARTMENT_OTHER): Payer: Medicare Other

## 2012-07-19 ENCOUNTER — Ambulatory Visit (HOSPITAL_BASED_OUTPATIENT_CLINIC_OR_DEPARTMENT_OTHER): Payer: Medicare Other | Admitting: Medical

## 2012-07-19 ENCOUNTER — Other Ambulatory Visit: Payer: Medicare Other | Admitting: Lab

## 2012-07-19 VITALS — BP 114/62 | HR 75 | Temp 97.5°F | Resp 18 | Ht 62.0 in | Wt 105.0 lb

## 2012-07-19 DIAGNOSIS — D4620 Refractory anemia with excess of blasts, unspecified: Secondary | ICD-10-CM

## 2012-07-19 DIAGNOSIS — D510 Vitamin B12 deficiency anemia due to intrinsic factor deficiency: Secondary | ICD-10-CM

## 2012-07-19 LAB — IRON AND TIBC
Iron: 50 ug/dL (ref 42–145)
TIBC: 263 ug/dL (ref 250–470)
UIBC: 213 ug/dL (ref 125–400)

## 2012-07-19 LAB — CBC WITH DIFFERENTIAL (CANCER CENTER ONLY)
BASO#: 0.1 10*3/uL (ref 0.0–0.2)
BASO%: 1 % (ref 0.0–2.0)
HCT: 27 % — ABNORMAL LOW (ref 34.8–46.6)
HGB: 9.8 g/dL — ABNORMAL LOW (ref 11.6–15.9)
LYMPH#: 0.5 10*3/uL — ABNORMAL LOW (ref 0.9–3.3)
MONO#: 0.3 10*3/uL (ref 0.1–0.9)
NEUT%: 81.7 % — ABNORMAL HIGH (ref 39.6–80.0)
RDW: 18.6 % — ABNORMAL HIGH (ref 11.1–15.7)
WBC: 5 10*3/uL (ref 3.9–10.0)

## 2012-07-19 LAB — FERRITIN: Ferritin: 1769 ng/mL — ABNORMAL HIGH (ref 10–291)

## 2012-07-19 LAB — CHCC SATELLITE - SMEAR

## 2012-07-19 MED ORDER — DARBEPOETIN ALFA-POLYSORBATE 300 MCG/0.6ML IJ SOLN
300.0000 ug | Freq: Once | INTRAMUSCULAR | Status: AC
  Administered 2012-07-19: 300 ug via SUBCUTANEOUS

## 2012-07-19 NOTE — Progress Notes (Signed)
Diagnoses: #1 refractory anemia.  #2 intermittent iron deficiency anemia. #3 pernicious anemia.  Current therapy: #1 Aranesp 300 mcg subcutaneous as needed.  For hemoglobin less than 11. #2 IV iron as indicated. #3 vitamin B12 2 mg by mouth daily.  Interim history: Nicole Harvey comes in today for an office followup visit.  Overall, she, reports, that she's been doing relatively well.  She did have to have IV iron.  Back on 06/08/2012, as well as Aranesp 300 mcg on July 24 as well.  Her last iron studies, that we had back from her in May only showed an iron saturation of 15%, with an iron of 40.  Her ferritin was 1378, again, which is an acute phase reactant.  Her hemoglobin back on 06/08/2012 was 7.6.  Today.  It is 9.8.  She does report, that she has noticed a decrease in her weight.  She is having problems with some short-term memory issues.  She reports, that her husband has Alzheimer's, and she is the sole caretaker.  They recently did a big move, and she has been time, to get things together, as well as take care of him.  She really feels, that it is quite overwhelming at this period and secondary to the stress is causing for her to have a decrease in appetite, as well as some memory issues.  She currently does not have a primary care physician, however, is in the process of finding one.  I did tell her that we could go ahead and make a referral for her.  She would like however, at this point.  She would like to go ahead and do her own exploring her primary care physician.  She's not reporting any excessive fatigue or weakness.  She's not reporting any obvious, or abnormal bleeding.  She denies any headaches or visual changes.  She denies any cough, chest pain, shortness of breath.  She denies any fevers, chills, or night sweats, or any type of palpable lymph nodes.  She denies any nausea, vomiting, diarrhea, constipation.  In the past.  We have tried to get her to do a bone marrow, biopsy.  However,  she, flat out refuses to do this.  It is quite possible that she has an element of myelodysplasia.  Review of Systems: weight loss, otherwise: Pt. Denies any changes in their vision, hearing, adenopathy, fevers, chills, nausea, vomiting, diarrhea, constipation, chest pain, shortness of breath, passing blood, passing out, blacking out,  any changes in skin, joints, neurologic or psychiatric except as noted.  Physical Exam: This is a very frail, elderly-appearing, 78 year old white female, in no obvious distress Vitals: Temperature 97.5 degrees, pulse 75, respirations 18, blood pressure 114/62.  Weight 105 pounds HEENT reveals a normocephalic, atraumatic skull, no scleral icterus, no oral lesions  Neck is supple without any cervical or supraclavicular adenopathy.  Lungs are clear to auscultation bilaterally. There are no wheezes, rales or rhonci Cardiac is regular rate and rhythm with a normal S1 and S2. There are no murmurs, rubs, or bruits.  Abdomen is soft with good bowel sounds, there is no palpable mass. There is no palpable hepatosplenomegaly. There is no palpable fluid wave.  Musculoskeletal no tenderness of the spine, ribs, or hips.  Extremities there are no clubbing, cyanosis, or edema.  Skin no petechia, purpura or ecchymosis Neurologic is nonfocal.  Laboratory Data: White count 5.0, hemoglobin 9.8, hematocrit 27.0, platelets 260,000  Current Outpatient Prescriptions on File Prior to Visit  Medication Sig Dispense  Refill  . aspirin 81 MG tablet Take 81 mg by mouth daily.      Marland Kitchen loratadine (CLARITIN) 10 MG tablet Take 10 mg by mouth daily. Instructed pt to bring list of meds and drug store info next visit.      Marland Kitchen vitamin B-12 (CYANOCOBALAMIN) 1000 MCG tablet Take 1,000 mcg by mouth 2 (two) times daily before a meal.       Assessment/Plan: This is a pleasant, elderly-appearing 78 year old, female, with the following issues: #1 refractory anemia.  She did receive IV iron, as well as  an Aranesp injection 300 mcg back on 06/08/2012.  At that time.  Her hemoglobin was 7.6.  Today.  It is not 0.8.  We will go ahead and give her an Aranesp injection 300 mcg subcutaneous for hemoglobin less than 11.  She will continue with monthly CBCs.  An Aranesp injection.  If need be.  We will continue to monitor her iron studies closely and provide IV iron as needed.  She could possibly have an element of myelodysplasia.  However, she has been quite reluctant to do a bone marrow, biopsy.  #2, pernicious anemia-she continues on vitamin B12 2 mg by mouth daily.  #3, stressors at home (new move/husband with Alzheimer's)-she is taking it upon herself to find a primary care physician.  #4.  Followup [name] will follow back up on a monthly basis for review her lab work, and iron studies and we will see her for an office visit in 3 months, but before then should there be questions or concerns.

## 2012-07-19 NOTE — Patient Instructions (Signed)
Darbepoetin Alfa injection What is this medicine? DARBEPOETIN ALFA (dar be POE e tin AL fa) helps your body make more red blood cells. It is used to treat anemia caused by chronic kidney failure and chemotherapy. This medicine may be used for other purposes; ask your health care provider or pharmacist if you have questions. What should I tell my health care provider before I take this medicine? They need to know if you have any of these conditions: -blood clotting disorders or history of blood clots -cancer patient not on chemotherapy -cystic fibrosis -heart disease, such as angina, heart failure, or a history of a heart attack -hemoglobin level of 12 g/dL or greater -high blood pressure -low levels of folate, iron, or vitamin B12 -seizures -an unusual or allergic reaction to darbepoetin, erythropoietin, albumin, hamster proteins, latex, other medicines, foods, dyes, or preservatives -pregnant or trying to get pregnant -breast-feeding How should I use this medicine? This medicine is for injection into a vein or under the skin. It is usually given by a health care professional in a hospital or clinic setting. If you get this medicine at home, you will be taught how to prepare and give this medicine. Do not shake the solution before you withdraw a dose. Use exactly as directed. Take your medicine at regular intervals. Do not take your medicine more often than directed. It is important that you put your used needles and syringes in a special sharps container. Do not put them in a trash can. If you do not have a sharps container, call your pharmacist or healthcare provider to get one. Talk to your pediatrician regarding the use of this medicine in children. While this medicine may be used in children as young as 1 year for selected conditions, precautions do apply. Overdosage: If you think you have taken too much of this medicine contact a poison control center or emergency room at once. NOTE:  This medicine is only for you. Do not share this medicine with others. What if I miss a dose? If you miss a dose, take it as soon as you can. If it is almost time for your next dose, take only that dose. Do not take double or extra doses. What may interact with this medicine? Do not take this medicine with any of the following medications: -epoetin alfa This list may not describe all possible interactions. Give your health care provider a list of all the medicines, herbs, non-prescription drugs, or dietary supplements you use. Also tell them if you smoke, drink alcohol, or use illegal drugs. Some items may interact with your medicine. What should I watch for while using this medicine? Visit your prescriber or health care professional for regular checks on your progress and for the needed blood tests and blood pressure measurements. It is especially important for the doctor to make sure your hemoglobin level is in the desired range, to limit the risk of potential side effects and to give you the best benefit. Keep all appointments for any recommended tests. Check your blood pressure as directed. Ask your doctor what your blood pressure should be and when you should contact him or her. As your body makes more red blood cells, you may need to take iron, folic acid, or vitamin B supplements. Ask your doctor or health care provider which products are right for you. If you have kidney disease continue dietary restrictions, even though this medication can make you feel better. Talk with your doctor or health care professional about the   foods you eat and the vitamins that you take. What side effects may I notice from receiving this medicine? Side effects that you should report to your doctor or health care professional as soon as possible: -allergic reactions like skin rash, itching or hives, swelling of the face, lips, or tongue -breathing problems -changes in vision -chest pain -confusion, trouble speaking  or understanding -feeling faint or lightheaded, falls -high blood pressure -muscle aches or pains -pain, swelling, warmth in the leg -rapid weight gain -severe headaches -sudden numbness or weakness of the face, arm or leg -trouble walking, dizziness, loss of balance or coordination -seizures (convulsions) -swelling of the ankles, feet, hands -unusually weak or tired Side effects that usually do not require medical attention (report to your doctor or health care professional if they continue or are bothersome): -diarrhea -fever, chills (flu-like symptoms) -headaches -nausea, vomiting -redness, stinging, or swelling at site where injected This list may not describe all possible side effects. Call your doctor for medical advice about side effects. You may report side effects to FDA at 1-800-FDA-1088. Where should I keep my medicine? Keep out of the reach of children. Store in a refrigerator between 2 and 8 degrees C (36 and 46 degrees F). Do not freeze. Do not shake. Throw away any unused portion if using a single-dose vial. Throw away any unused medicine after the expiration date. NOTE: This sheet is a summary. It may not cover all possible information. If you have questions about this medicine, talk to your doctor, pharmacist, or health care provider.  2012, [employer]. (10/16/2008 10:23:57 AM) 

## 2012-08-18 ENCOUNTER — Other Ambulatory Visit (HOSPITAL_BASED_OUTPATIENT_CLINIC_OR_DEPARTMENT_OTHER): Payer: Medicare Other | Admitting: Lab

## 2012-08-18 ENCOUNTER — Ambulatory Visit (HOSPITAL_BASED_OUTPATIENT_CLINIC_OR_DEPARTMENT_OTHER): Payer: Medicare Other

## 2012-08-18 VITALS — BP 111/60 | HR 79 | Temp 97.8°F | Resp 20

## 2012-08-18 DIAGNOSIS — D4620 Refractory anemia with excess of blasts, unspecified: Secondary | ICD-10-CM

## 2012-08-18 DIAGNOSIS — D46Z Other myelodysplastic syndromes: Secondary | ICD-10-CM

## 2012-08-18 LAB — CBC WITH DIFFERENTIAL (CANCER CENTER ONLY)
BASO%: 0.9 % (ref 0.0–2.0)
EOS%: 1.8 % (ref 0.0–7.0)
HCT: 25.2 % — ABNORMAL LOW (ref 34.8–46.6)
LYMPH#: 0.6 10*3/uL — ABNORMAL LOW (ref 0.9–3.3)
LYMPH%: 9.8 % — ABNORMAL LOW (ref 14.0–48.0)
MCHC: 36.9 g/dL — ABNORMAL HIGH (ref 32.0–36.0)
MCV: 110 fL — ABNORMAL HIGH (ref 81–101)
MONO%: 6.2 % (ref 0.0–13.0)
NEUT%: 81.3 % — ABNORMAL HIGH (ref 39.6–80.0)
RDW: UNDETERMINED % (ref 11.1–15.7)

## 2012-08-18 MED ORDER — DARBEPOETIN ALFA-POLYSORBATE 300 MCG/0.6ML IJ SOLN
300.0000 ug | Freq: Once | INTRAMUSCULAR | Status: AC
  Administered 2012-08-18: 300 ug via SUBCUTANEOUS

## 2012-08-18 NOTE — Patient Instructions (Addendum)
Darbepoetin Alfa injection What is this medicine? DARBEPOETIN ALFA (dar be POE e tin AL fa) helps your body make more red blood cells. It is used to treat anemia caused by chronic kidney failure and chemotherapy. This medicine may be used for other purposes; ask your health care provider or pharmacist if you have questions. What should I tell my health care provider before I take this medicine? They need to know if you have any of these conditions: -blood clotting disorders or history of blood clots -cancer patient not on chemotherapy -cystic fibrosis -heart disease, such as angina, heart failure, or a history of a heart attack -hemoglobin level of 12 g/dL or greater -high blood pressure -low levels of folate, iron, or vitamin B12 -seizures -an unusual or allergic reaction to darbepoetin, erythropoietin, albumin, hamster proteins, latex, other medicines, foods, dyes, or preservatives -pregnant or trying to get pregnant -breast-feeding How should I use this medicine? This medicine is for injection into a vein or under the skin. It is usually given by a health care professional in a hospital or clinic setting. If you get this medicine at home, you will be taught how to prepare and give this medicine. Do not shake the solution before you withdraw a dose. Use exactly as directed. Take your medicine at regular intervals. Do not take your medicine more often than directed. It is important that you put your used needles and syringes in a special sharps container. Do not put them in a trash can. If you do not have a sharps container, call your pharmacist or healthcare provider to get one. Talk to your pediatrician regarding the use of this medicine in children. While this medicine may be used in children as young as 1 year for selected conditions, precautions do apply. Overdosage: If you think you have taken too much of this medicine contact a poison control center or emergency room at once. NOTE:  This medicine is only for you. Do not share this medicine with others. What if I miss a dose? If you miss a dose, take it as soon as you can. If it is almost time for your next dose, take only that dose. Do not take double or extra doses. What may interact with this medicine? Do not take this medicine with any of the following medications: -epoetin alfa This list may not describe all possible interactions. Give your health care provider a list of all the medicines, herbs, non-prescription drugs, or dietary supplements you use. Also tell them if you smoke, drink alcohol, or use illegal drugs. Some items may interact with your medicine. What should I watch for while using this medicine? Visit your prescriber or health care professional for regular checks on your progress and for the needed blood tests and blood pressure measurements. It is especially important for the doctor to make sure your hemoglobin level is in the desired range, to limit the risk of potential side effects and to give you the best benefit. Keep all appointments for any recommended tests. Check your blood pressure as directed. Ask your doctor what your blood pressure should be and when you should contact him or her. As your body makes more red blood cells, you may need to take iron, folic acid, or vitamin B supplements. Ask your doctor or health care provider which products are right for you. If you have kidney disease continue dietary restrictions, even though this medication can make you feel better. Talk with your doctor or health care professional about the   foods you eat and the vitamins that you take. What side effects may I notice from receiving this medicine? Side effects that you should report to your doctor or health care professional as soon as possible: -allergic reactions like skin rash, itching or hives, swelling of the face, lips, or tongue -breathing problems -changes in vision -chest pain -confusion, trouble speaking  or understanding -feeling faint or lightheaded, falls -high blood pressure -muscle aches or pains -pain, swelling, warmth in the leg -rapid weight gain -severe headaches -sudden numbness or weakness of the face, arm or leg -trouble walking, dizziness, loss of balance or coordination -seizures (convulsions) -swelling of the ankles, feet, hands -unusually weak or tired Side effects that usually do not require medical attention (report to your doctor or health care professional if they continue or are bothersome): -diarrhea -fever, chills (flu-like symptoms) -headaches -nausea, vomiting -redness, stinging, or swelling at site where injected This list may not describe all possible side effects. Call your doctor for medical advice about side effects. You may report side effects to FDA at 1-800-FDA-1088. Where should I keep my medicine? Keep out of the reach of children. Store in a refrigerator between 2 and 8 degrees C (36 and 46 degrees F). Do not freeze. Do not shake. Throw away any unused portion if using a single-dose vial. Throw away any unused medicine after the expiration date. NOTE: This sheet is a summary. It may not cover all possible information. If you have questions about this medicine, talk to your doctor, pharmacist, or health care provider.  2012, [employer]. (10/16/2008 10:23:57 AM) 

## 2012-09-19 ENCOUNTER — Ambulatory Visit (HOSPITAL_BASED_OUTPATIENT_CLINIC_OR_DEPARTMENT_OTHER): Payer: Medicare Other

## 2012-09-19 ENCOUNTER — Other Ambulatory Visit (HOSPITAL_BASED_OUTPATIENT_CLINIC_OR_DEPARTMENT_OTHER): Payer: Medicare Other | Admitting: Lab

## 2012-09-19 ENCOUNTER — Other Ambulatory Visit: Payer: Self-pay | Admitting: Medical

## 2012-09-19 VITALS — BP 122/62 | HR 81 | Temp 97.6°F | Resp 18

## 2012-09-19 DIAGNOSIS — D4620 Refractory anemia with excess of blasts, unspecified: Secondary | ICD-10-CM

## 2012-09-19 LAB — CBC WITH DIFFERENTIAL (CANCER CENTER ONLY)
BASO%: 1 % (ref 0.0–2.0)
EOS%: 1.4 % (ref 0.0–7.0)
LYMPH#: 0.5 10*3/uL — ABNORMAL LOW (ref 0.9–3.3)
MCHC: 35.7 g/dL (ref 32.0–36.0)
NEUT#: 4.8 10*3/uL (ref 1.5–6.5)
Platelets: 295 10*3/uL (ref 145–400)
RDW: 18.5 % — ABNORMAL HIGH (ref 11.1–15.7)

## 2012-09-19 MED ORDER — DARBEPOETIN ALFA-POLYSORBATE 300 MCG/0.6ML IJ SOLN
300.0000 ug | Freq: Once | INTRAMUSCULAR | Status: AC
  Administered 2012-09-19: 300 ug via SUBCUTANEOUS

## 2012-10-18 ENCOUNTER — Ambulatory Visit (HOSPITAL_BASED_OUTPATIENT_CLINIC_OR_DEPARTMENT_OTHER): Payer: Medicare Other

## 2012-10-18 ENCOUNTER — Other Ambulatory Visit (HOSPITAL_BASED_OUTPATIENT_CLINIC_OR_DEPARTMENT_OTHER): Payer: Medicare Other | Admitting: Lab

## 2012-10-18 ENCOUNTER — Ambulatory Visit (HOSPITAL_BASED_OUTPATIENT_CLINIC_OR_DEPARTMENT_OTHER): Payer: Medicare Other | Admitting: Medical

## 2012-10-18 VITALS — BP 108/62 | HR 80 | Temp 97.8°F | Resp 16 | Ht 62.0 in | Wt 111.0 lb

## 2012-10-18 DIAGNOSIS — D4620 Refractory anemia with excess of blasts, unspecified: Secondary | ICD-10-CM

## 2012-10-18 DIAGNOSIS — D510 Vitamin B12 deficiency anemia due to intrinsic factor deficiency: Secondary | ICD-10-CM

## 2012-10-18 DIAGNOSIS — D509 Iron deficiency anemia, unspecified: Secondary | ICD-10-CM

## 2012-10-18 LAB — CBC WITH DIFFERENTIAL (CANCER CENTER ONLY)
BASO#: 0.1 10*3/uL (ref 0.0–0.2)
EOS%: 2.8 % (ref 0.0–7.0)
Eosinophils Absolute: 0.1 10*3/uL (ref 0.0–0.5)
HGB: 9.8 g/dL — ABNORMAL LOW (ref 11.6–15.9)
LYMPH#: 0.5 10*3/uL — ABNORMAL LOW (ref 0.9–3.3)
NEUT#: 4 10*3/uL (ref 1.5–6.5)
RBC: 2.64 10*6/uL — ABNORMAL LOW (ref 3.70–5.32)

## 2012-10-18 LAB — RETICULOCYTES (CHCC): ABS Retic: 94.2 10*3/uL (ref 19.0–186.0)

## 2012-10-18 LAB — IRON AND TIBC
%SAT: 18 % — ABNORMAL LOW (ref 20–55)
TIBC: 285 ug/dL (ref 250–470)
UIBC: 234 ug/dL (ref 125–400)

## 2012-10-18 LAB — FERRITIN: Ferritin: 1510 ng/mL — ABNORMAL HIGH (ref 10–291)

## 2012-10-18 MED ORDER — DARBEPOETIN ALFA-POLYSORBATE 300 MCG/0.6ML IJ SOLN
300.0000 ug | Freq: Once | INTRAMUSCULAR | Status: AC
  Administered 2012-10-18: 300 ug via SUBCUTANEOUS

## 2012-10-18 NOTE — Progress Notes (Addendum)
Diagnoses: #1 refractory anemia.  #2 intermittent iron deficiency anemia. #3 pernicious anemia.  Current therapy: #1 Aranesp 300 mcg subcutaneous as needed.  For hemoglobin less than 11. #2 IV iron as indicated. #3 vitamin B12 2 mg by mouth daily.  Interim history: Nicole Harvey comes in today for an office followup visit.  Overall, she, reports, that she's been doing relatively well.  She did have to have IV iron back on 06/08/2012  Her last iron studies, that we had back from her in September showed an iron saturation of 19%, with an iron of 50.  Her ferritin was 1769, again, which is an acute phase reactant.  Her hemoglobin today is 9.8.  She is having problems with some short-term memory issues.  She reports, that her husband has Alzheimer's, and she is the sole caretaker.  They recently did a big move, and she has been time, to get things together, as well as take care of him.  She really feels, that it is quite overwhelming at this period and secondary to the stress is causing for her to have a decrease in appetite, as well as some memory issues.  She currently does not have a primary care physician, however, is in the process of finding one.  She's not reporting any excessive fatigue or weakness.  She's not reporting any obvious, or abnormal bleeding.  She denies any headaches or visual changes.  She denies any cough, chest pain, shortness of breath.  She denies any fevers, chills, or night sweats, or any type of palpable lymph nodes.  She denies any nausea, vomiting, diarrhea, constipation.  In the past we have tried to get her to do a bone marrow, biopsy.  However, she, flat out refuses to do this.  It is quite possible that she has an element of myelodysplasia.  Review of Systems: weight loss, otherwise: Pt. Denies any changes in their vision, hearing, adenopathy, fevers, chills, nausea, vomiting, diarrhea, constipation, chest pain, shortness of breath, passing blood, passing out, blacking  out,  any changes in skin, joints, neurologic or psychiatric except as noted.  Physical Exam: This is a very frail, elderly-appearing, 78 year old white female, in no obvious distress Vitals: temperature 97.8 degrees, pulse 80, respirations 16, blood pressure 108/62.  Weight 111 pounds  HEENT reveals a normocephalic, atraumatic skull, no scleral icterus, no oral lesions  Neck is supple without any cervical or supraclavicular adenopathy.  Lungs are clear to auscultation bilaterally. There are no wheezes, rales or rhonci Cardiac is regular rate and rhythm with a normal S1 and S2. There are no murmurs, rubs, or bruits.  Abdomen is soft with good bowel sounds, there is no palpable mass. There is no palpable hepatosplenomegaly. There is no palpable fluid wave.  Musculoskeletal no tenderness of the spine, ribs, or hips.  Extremities there are no clubbing, cyanosis, or edema.  Skin no petechia, purpura or ecchymosis Neurologic is nonfocal.  Laboratory Data: White count 5.0, hemoglobin 9.8, hematocrit 20.1, platelets 258,000  Current Outpatient Prescriptions on File Prior to Visit  Medication Sig Dispense Refill  . aspirin 81 MG tablet Take 81 mg by mouth daily.      Marland Kitchen loratadine (CLARITIN) 10 MG tablet Take 10 mg by mouth daily. Instructed pt to bring list of meds and drug store info next visit.      Marland Kitchen vitamin B-12 (CYANOCOBALAMIN) 1000 MCG tablet Take 1,000 mcg by mouth 2 (two) times daily before a meal.       Assessment/Plan:  This is a pleasant, elderly-appearing 78 year old, female, with the following issues:  #1 refractory anemia.  Her hemoglobin is below 11 today, as such she will go ahead and receive an Aranesp injection. She will continue with monthly CBCs and Aranesp as needed.  We will continue to monitor her iron studies closely and provide IV iron as needed.  She could possibly have an element of myelodysplasia.  However, she has been quite reluctant to do a bone marrow, biopsy.  #2,  pernicious anemia-she continues on vitamin B12 2 mg by mouth daily.  #3, stressors at home (new move/husband with Alzheimer's)-she is taking it upon herself to find a primary care physician.  #4.  Followup Nicole Harvey will follow back up on a monthly basis for lab work, and we will see her for an office visit in 3 months, but before then should there be questions or concerns.

## 2012-10-18 NOTE — Patient Instructions (Signed)
Darbepoetin Alfa injection What is this medicine? DARBEPOETIN ALFA (dar be POE e tin AL fa) helps your body make more red blood cells. It is used to treat anemia caused by chronic kidney failure and chemotherapy. This medicine may be used for other purposes; ask your health care provider or pharmacist if you have questions. What should I tell my health care provider before I take this medicine? They need to know if you have any of these conditions: -blood clotting disorders or history of blood clots -cancer patient not on chemotherapy -cystic fibrosis -heart disease, such as angina, heart failure, or a history of a heart attack -hemoglobin level of 12 g/dL or greater -high blood pressure -low levels of folate, iron, or vitamin B12 -seizures -an unusual or allergic reaction to darbepoetin, erythropoietin, albumin, hamster proteins, latex, other medicines, foods, dyes, or preservatives -pregnant or trying to get pregnant -breast-feeding How should I use this medicine? This medicine is for injection into a vein or under the skin. It is usually given by a health care professional in a hospital or clinic setting. If you get this medicine at home, you will be taught how to prepare and give this medicine. Do not shake the solution before you withdraw a dose. Use exactly as directed. Take your medicine at regular intervals. Do not take your medicine more often than directed. It is important that you put your used needles and syringes in a special sharps container. Do not put them in a trash can. If you do not have a sharps container, call your pharmacist or healthcare provider to get one. Talk to your pediatrician regarding the use of this medicine in children. While this medicine may be used in children as young as 1 year for selected conditions, precautions do apply. Overdosage: If you think you have taken too much of this medicine contact a poison control center or emergency room at once. NOTE:  This medicine is only for you. Do not share this medicine with others. What if I miss a dose? If you miss a dose, take it as soon as you can. If it is almost time for your next dose, take only that dose. Do not take double or extra doses. What may interact with this medicine? Do not take this medicine with any of the following medications: -epoetin alfa This list may not describe all possible interactions. Give your health care provider a list of all the medicines, herbs, non-prescription drugs, or dietary supplements you use. Also tell them if you smoke, drink alcohol, or use illegal drugs. Some items may interact with your medicine. What should I watch for while using this medicine? Visit your prescriber or health care professional for regular checks on your progress and for the needed blood tests and blood pressure measurements. It is especially important for the doctor to make sure your hemoglobin level is in the desired range, to limit the risk of potential side effects and to give you the best benefit. Keep all appointments for any recommended tests. Check your blood pressure as directed. Ask your doctor what your blood pressure should be and when you should contact him or her. As your body makes more red blood cells, you may need to take iron, folic acid, or vitamin B supplements. Ask your doctor or health care provider which products are right for you. If you have kidney disease continue dietary restrictions, even though this medication can make you feel better. Talk with your doctor or health care professional about the   foods you eat and the vitamins that you take. What side effects may I notice from receiving this medicine? Side effects that you should report to your doctor or health care professional as soon as possible: -allergic reactions like skin rash, itching or hives, swelling of the face, lips, or tongue -breathing problems -changes in vision -chest pain -confusion, trouble speaking  or understanding -feeling faint or lightheaded, falls -high blood pressure -muscle aches or pains -pain, swelling, warmth in the leg -rapid weight gain -severe headaches -sudden numbness or weakness of the face, arm or leg -trouble walking, dizziness, loss of balance or coordination -seizures (convulsions) -swelling of the ankles, feet, hands -unusually weak or tired Side effects that usually do not require medical attention (report to your doctor or health care professional if they continue or are bothersome): -diarrhea -fever, chills (flu-like symptoms) -headaches -nausea, vomiting -redness, stinging, or swelling at site where injected This list may not describe all possible side effects. Call your doctor for medical advice about side effects. You may report side effects to FDA at 1-800-FDA-1088. Where should I keep my medicine? Keep out of the reach of children. Store in a refrigerator between 2 and 8 degrees C (36 and 46 degrees F). Do not freeze. Do not shake. Throw away any unused portion if using a single-dose vial. Throw away any unused medicine after the expiration date. NOTE: This sheet is a summary. It may not cover all possible information. If you have questions about this medicine, talk to your doctor, pharmacist, or health care provider.  2012, [employer]. (10/16/2008 10:23:57 AM) 

## 2012-11-18 ENCOUNTER — Ambulatory Visit: Payer: Medicare Other

## 2012-11-18 ENCOUNTER — Telehealth: Payer: Self-pay | Admitting: Hematology & Oncology

## 2012-11-18 ENCOUNTER — Other Ambulatory Visit: Payer: Medicare Other | Admitting: Lab

## 2012-11-18 NOTE — Telephone Encounter (Signed)
Patient called and cx 11/18/12 apt due to knee pain and resch for 12/02/12

## 2012-12-02 ENCOUNTER — Ambulatory Visit (HOSPITAL_BASED_OUTPATIENT_CLINIC_OR_DEPARTMENT_OTHER): Payer: Medicare Other

## 2012-12-02 ENCOUNTER — Other Ambulatory Visit (HOSPITAL_BASED_OUTPATIENT_CLINIC_OR_DEPARTMENT_OTHER): Payer: Medicare Other | Admitting: Lab

## 2012-12-02 VITALS — BP 102/63 | HR 81 | Temp 97.1°F | Resp 16

## 2012-12-02 DIAGNOSIS — D4620 Refractory anemia with excess of blasts, unspecified: Secondary | ICD-10-CM

## 2012-12-02 DIAGNOSIS — D509 Iron deficiency anemia, unspecified: Secondary | ICD-10-CM

## 2012-12-02 LAB — CBC WITH DIFFERENTIAL (CANCER CENTER ONLY)
BASO%: 1 % (ref 0.0–2.0)
EOS%: 1.5 % (ref 0.0–7.0)
HCT: 29.5 % — ABNORMAL LOW (ref 34.8–46.6)
LYMPH#: 0.5 10*3/uL — ABNORMAL LOW (ref 0.9–3.3)
LYMPH%: 7.6 % — ABNORMAL LOW (ref 14.0–48.0)
MCH: 33.7 pg (ref 26.0–34.0)
MCHC: 33.9 g/dL (ref 32.0–36.0)
MCV: 99 fL (ref 81–101)
MONO%: 5.3 % (ref 0.0–13.0)
NEUT%: 84.6 % — ABNORMAL HIGH (ref 39.6–80.0)
RDW: 15.4 % (ref 11.1–15.7)

## 2012-12-02 MED ORDER — DARBEPOETIN ALFA-POLYSORBATE 300 MCG/0.6ML IJ SOLN
300.0000 ug | Freq: Once | INTRAMUSCULAR | Status: AC
  Administered 2012-12-02: 300 ug via SUBCUTANEOUS

## 2012-12-02 NOTE — Patient Instructions (Signed)
Darbepoetin Alfa injection What is this medicine? DARBEPOETIN ALFA (dar be POE e tin AL fa) helps your body make more red blood cells. It is used to treat anemia caused by chronic kidney failure and chemotherapy. This medicine may be used for other purposes; ask your health care provider or pharmacist if you have questions. What should I tell my health care provider before I take this medicine? They need to know if you have any of these conditions: -blood clotting disorders or history of blood clots -cancer patient not on chemotherapy -cystic fibrosis -heart disease, such as angina, heart failure, or a history of a heart attack -hemoglobin level of 12 g/dL or greater -high blood pressure -low levels of folate, iron, or vitamin B12 -seizures -an unusual or allergic reaction to darbepoetin, erythropoietin, albumin, hamster proteins, latex, other medicines, foods, dyes, or preservatives -pregnant or trying to get pregnant -breast-feeding How should I use this medicine? This medicine is for injection into a vein or under the skin. It is usually given by a health care professional in a hospital or clinic setting. If you get this medicine at home, you will be taught how to prepare and give this medicine. Do not shake the solution before you withdraw a dose. Use exactly as directed. Take your medicine at regular intervals. Do not take your medicine more often than directed. It is important that you put your used needles and syringes in a special sharps container. Do not put them in a trash can. If you do not have a sharps container, call your pharmacist or healthcare provider to get one. Talk to your pediatrician regarding the use of this medicine in children. While this medicine may be used in children as young as 1 year for selected conditions, precautions do apply. Overdosage: If you think you have taken too much of this medicine contact a poison control center or emergency room at once. NOTE:  This medicine is only for you. Do not share this medicine with others. What if I miss a dose? If you miss a dose, take it as soon as you can. If it is almost time for your next dose, take only that dose. Do not take double or extra doses. What may interact with this medicine? Do not take this medicine with any of the following medications: -epoetin alfa This list may not describe all possible interactions. Give your health care provider a list of all the medicines, herbs, non-prescription drugs, or dietary supplements you use. Also tell them if you smoke, drink alcohol, or use illegal drugs. Some items may interact with your medicine. What should I watch for while using this medicine? Visit your prescriber or health care professional for regular checks on your progress and for the needed blood tests and blood pressure measurements. It is especially important for the doctor to make sure your hemoglobin level is in the desired range, to limit the risk of potential side effects and to give you the best benefit. Keep all appointments for any recommended tests. Check your blood pressure as directed. Ask your doctor what your blood pressure should be and when you should contact him or her. As your body makes more red blood cells, you may need to take iron, folic acid, or vitamin B supplements. Ask your doctor or health care provider which products are right for you. If you have kidney disease continue dietary restrictions, even though this medication can make you feel better. Talk with your doctor or health care professional about the   foods you eat and the vitamins that you take. What side effects may I notice from receiving this medicine? Side effects that you should report to your doctor or health care professional as soon as possible: -allergic reactions like skin rash, itching or hives, swelling of the face, lips, or tongue -breathing problems -changes in vision -chest pain -confusion, trouble speaking  or understanding -feeling faint or lightheaded, falls -high blood pressure -muscle aches or pains -pain, swelling, warmth in the leg -rapid weight gain -severe headaches -sudden numbness or weakness of the face, arm or leg -trouble walking, dizziness, loss of balance or coordination -seizures (convulsions) -swelling of the ankles, feet, hands -unusually weak or tired Side effects that usually do not require medical attention (report to your doctor or health care professional if they continue or are bothersome): -diarrhea -fever, chills (flu-like symptoms) -headaches -nausea, vomiting -redness, stinging, or swelling at site where injected This list may not describe all possible side effects. Call your doctor for medical advice about side effects. You may report side effects to FDA at 1-800-FDA-1088. Where should I keep my medicine? Keep out of the reach of children. Store in a refrigerator between 2 and 8 degrees C (36 and 46 degrees F). Do not freeze. Do not shake. Throw away any unused portion if using a single-dose vial. Throw away any unused medicine after the expiration date. NOTE: This sheet is a summary. It may not cover all possible information. If you have questions about this medicine, talk to your doctor, pharmacist, or health care provider.  2012, [employer]. (10/16/2008 10:23:57 AM) 

## 2012-12-19 ENCOUNTER — Encounter: Payer: Self-pay | Admitting: *Deleted

## 2012-12-19 ENCOUNTER — Other Ambulatory Visit (HOSPITAL_BASED_OUTPATIENT_CLINIC_OR_DEPARTMENT_OTHER): Payer: Medicare Other | Admitting: Lab

## 2012-12-19 ENCOUNTER — Ambulatory Visit: Payer: Medicare Other

## 2012-12-19 DIAGNOSIS — D4620 Refractory anemia with excess of blasts, unspecified: Secondary | ICD-10-CM

## 2012-12-19 LAB — CBC WITH DIFFERENTIAL (CANCER CENTER ONLY)
BASO%: 0.8 % (ref 0.0–2.0)
Eosinophils Absolute: 0.1 10*3/uL (ref 0.0–0.5)
LYMPH#: 0.4 10*3/uL — ABNORMAL LOW (ref 0.9–3.3)
LYMPH%: 7.1 % — ABNORMAL LOW (ref 14.0–48.0)
MCV: 103 fL — ABNORMAL HIGH (ref 81–101)
MONO#: 0.4 10*3/uL (ref 0.1–0.9)
Platelets: 308 10*3/uL (ref 145–400)
RBC: 3.19 10*6/uL — ABNORMAL LOW (ref 3.70–5.32)
RDW: 18.9 % — ABNORMAL HIGH (ref 11.1–15.7)
WBC: 6.2 10*3/uL (ref 3.9–10.0)

## 2013-01-16 ENCOUNTER — Ambulatory Visit: Payer: Medicare Other

## 2013-01-16 ENCOUNTER — Ambulatory Visit: Payer: Medicare Other | Admitting: Medical

## 2013-01-16 ENCOUNTER — Other Ambulatory Visit: Payer: Medicare Other | Admitting: Lab

## 2013-01-26 ENCOUNTER — Telehealth: Payer: Self-pay | Admitting: Hematology & Oncology

## 2013-01-26 NOTE — Telephone Encounter (Signed)
Called patient to reschedule missed 01-16-13 appointment. No answer or voice mail, contact number has been cut off. Sent letter to pt to call and reschedule appointment.

## 2013-01-30 ENCOUNTER — Telehealth: Payer: Self-pay | Admitting: Hematology & Oncology

## 2013-01-30 NOTE — Telephone Encounter (Signed)
Pt received letter said she would not be coming here anymore that she paid her last bill in December and is not coming here anymore. I offered her [facility] but she said her son was arranging something else. RN Amy aware. She said she didn't want any more trouble from [place], Im not sure what she meant and I didn't ask. I do know she has had trouble with her bills.

## 2013-01-31 ENCOUNTER — Other Ambulatory Visit: Payer: Self-pay | Admitting: Medical

## 2013-04-28 ENCOUNTER — Inpatient Hospital Stay (HOSPITAL_COMMUNITY)
Admission: EM | Admit: 2013-04-28 | Discharge: 2013-04-29 | DRG: 811 | Disposition: A | Discharge status: Home / Self Care | Payer: Medicare Other | Attending: Internal Medicine | Admitting: Internal Medicine

## 2013-04-28 ENCOUNTER — Encounter (HOSPITAL_COMMUNITY): Payer: Self-pay | Admitting: Emergency Medicine

## 2013-04-28 DIAGNOSIS — D510 Vitamin B12 deficiency anemia due to intrinsic factor deficiency: Secondary | ICD-10-CM

## 2013-04-28 DIAGNOSIS — D649 Anemia, unspecified: Secondary | ICD-10-CM

## 2013-04-28 DIAGNOSIS — D46Z Other myelodysplastic syndromes: Secondary | ICD-10-CM | POA: Diagnosis present

## 2013-04-28 DIAGNOSIS — Z7982 Long term (current) use of aspirin: Secondary | ICD-10-CM

## 2013-04-28 DIAGNOSIS — I359 Nonrheumatic aortic valve disorder, unspecified: Secondary | ICD-10-CM | POA: Diagnosis present

## 2013-04-28 DIAGNOSIS — D4620 Refractory anemia with excess of blasts, unspecified: Principal | ICD-10-CM | POA: Diagnosis present

## 2013-04-28 DIAGNOSIS — G9340 Encephalopathy, unspecified: Secondary | ICD-10-CM

## 2013-04-28 DIAGNOSIS — E876 Hypokalemia: Secondary | ICD-10-CM | POA: Diagnosis present

## 2013-04-28 DIAGNOSIS — G9349 Other encephalopathy: Secondary | ICD-10-CM | POA: Diagnosis present

## 2013-04-28 DIAGNOSIS — R011 Cardiac murmur, unspecified: Secondary | ICD-10-CM | POA: Diagnosis present

## 2013-04-28 DIAGNOSIS — D509 Iron deficiency anemia, unspecified: Secondary | ICD-10-CM | POA: Diagnosis present

## 2013-04-28 HISTORY — DX: Other specified postprocedural states: Z98.890

## 2013-04-28 HISTORY — DX: Nausea with vomiting, unspecified: R11.2

## 2013-04-28 HISTORY — DX: Anemia, unspecified: D64.9

## 2013-04-28 LAB — BASIC METABOLIC PANEL
BUN: 11 mg/dL (ref 6–23)
CO2: 27 mEq/L (ref 19–32)
Chloride: 104 mEq/L (ref 96–112)
Creatinine, Ser: 0.44 mg/dL — ABNORMAL LOW (ref 0.50–1.10)
GFR calc Af Amer: >90 mL/min (ref >90)
Glucose, Bld: 97 mg/dL (ref 70–99)
Potassium: 3.3 mEq/L — ABNORMAL LOW (ref 3.5–5.1)

## 2013-04-28 LAB — HEPATIC FUNCTION PANEL
ALT: 11 U/L (ref 0–35)
Bilirubin, Direct: 0.2 mg/dL (ref 0.0–0.3)
Indirect Bilirubin: 1.2 mg/dL — ABNORMAL HIGH (ref 0.3–0.9)
Total Protein: 7 g/dL (ref 6.0–8.3)

## 2013-04-28 LAB — CBC
HCT: 17.1 % — ABNORMAL LOW (ref 36.0–46.0)
Hemoglobin: 6.5 g/dL — CL (ref 12.0–15.0)
MCV: 120.4 fL — ABNORMAL HIGH (ref 78.0–100.0)
RBC: 1.42 MIL/uL — ABNORMAL LOW (ref 3.87–5.11)
WBC: 4.8 10*3/uL (ref 4.0–10.5)

## 2013-04-28 MED ORDER — ACETAMINOPHEN 325 MG PO TABS
650.0000 mg | ORAL_TABLET | Freq: Once | ORAL | Status: AC
  Administered 2013-04-28: 650 mg via ORAL
  Filled 2013-04-28: qty 2

## 2013-04-28 MED ORDER — ACETAMINOPHEN 650 MG RE SUPP: 650.0000 mg | Freq: Four times a day (QID) | RECTAL | Status: DC | PRN

## 2013-04-28 MED ORDER — ACETAMINOPHEN 325 MG PO TABS: 650.0000 mg | ORAL_TABLET | Freq: Four times a day (QID) | ORAL | Status: DC | PRN

## 2013-04-28 MED ORDER — ONDANSETRON HCL 4 MG/2ML IJ SOLN: 4.0000 mg | Freq: Three times a day (TID) | INTRAMUSCULAR | Status: DC | PRN

## 2013-04-28 MED ORDER — POTASSIUM CHLORIDE CRYS ER 20 MEQ PO TBCR
20.0000 meq | EXTENDED_RELEASE_TABLET | Freq: Two times a day (BID) | ORAL | Status: DC
  Filled 2013-04-28 (×3): qty 1

## 2013-04-28 MED ORDER — ONDANSETRON HCL 4 MG/2ML IJ SOLN: 4.0000 mg | Freq: Four times a day (QID) | INTRAMUSCULAR | Status: DC | PRN

## 2013-04-28 MED ORDER — SODIUM CHLORIDE 0.9 % IJ SOLN
3.0000 mL | Freq: Two times a day (BID) | INTRAMUSCULAR | Status: DC
  Administered 2013-04-28 – 2013-04-29 (×2): 3 mL via INTRAVENOUS

## 2013-04-28 MED ORDER — ONDANSETRON HCL 4 MG PO TABS: 4.0000 mg | ORAL_TABLET | Freq: Four times a day (QID) | ORAL | Status: DC | PRN

## 2013-04-28 NOTE — Progress Notes (Signed)
UR completed 

## 2013-04-28 NOTE — ED Notes (Signed)
Pt reports she does not know why she is here today, states that her son told her that she needed to come to the ED.  Per pt, she was not informed of the reason why she is here or that her Hgb is low.  Pt reports mild weakness but denies any dizziness or pain at this time.  Pt is A&Ox 4.

## 2013-04-28 NOTE — ED Notes (Signed)
Son at bedside reports possible episode of "itching" with last blood administration. Spoke with Dr. Julian Reil, states if this reaction occurs again pt may have Benadryl.

## 2013-04-28 NOTE — ED Notes (Signed)
Pt from PCP Dr. Jeannetta Nap. Hgb 6.6.  Ordered 2 units packed RBCs.  Has been seen in the past for anemia.

## 2013-04-28 NOTE — H&P (Addendum)
[facility] History and Physical  Nicole Harvey YNW:[id number] DOB: [birth year] DOA: 04/28/2013   PCP: Kaleen Mask, MD   Chief Complaint: symptomatic anemia with confusion  HPI:  79 year old female with a history of myelodysplastic syndrome, pernicious anemia, iron deficiency anemia presents with one-week history of worsening confusion. The patient is currently confused and is an unreliable historian. Much of this history is obtained from speaking with the ED physician as well as the patient's son at the bedside. The patient went to see her primary care provider earlier on the day of admission for progressive fatigue over the past week. Routine bloodwork was performed and showed a low hemoglobin. She was called and told to come to the emergency department for further evaluation. The patient's son states that the patient has had a gradual decline in cognitive function over the past year, but it has been getting worse particularly in the last 1 week. The patient and her husband currently live with her son. As stated, the patient has had increasing fatigue over the past week, but she denies any chest discomfort, shortness of breath, nausea, vomiting, diarrhea, abdominal pain, tissue area, hematuria, syncope, or falls. She has had some dizziness. There is no headache or visual disturbance.  The patient previously followed Dr. Myna Hidalgo and received intermittent iron injections as well as Aranesp. However, the patient has not followed up in over 6 months. The patient denies any NSAID use or new medications. In emergency department, the patient was found to have a hemoglobin of 6.5. BMP was unremarkable except for potassium of 3.3. Hepatic panel was negative. The patient was afebrile and hemodynamically stable. The patient was given 1 L normal saline.   Hemoccult was negative in emergency department she was typed and crossed for blood transfusion.  Assessment/Plan: Acute encephalopathy   -Given the son's history, the patient may have some underlying cognitive deficit  (Dementia) -Her acute worsening may be partly a result of her drop in hemoglobin but suspect possible other etiologies as the patient's hemoglobin and likely has made a gradual decline without any signs of active bleeding -Check UA with reflux to urine culture  -Check serum B12, RBC folate --patient does have history of pernicious anemia  -Check TSH  -Check EKG  -Urine drug screen  -ammonia symptomatic anemia -Patient with known iron deficiency and pernicious anemia -Check iron studies prior to transfusion -LDH and haptoglobin -Baseline hemoglobin 10-11 -Hemoglobin was 11.2 on 12/19/2012 Hypokalemia  -Replete -Check magnesium  High-pitched systolic murmur  -Suspect aortic stenosis  -Check echocardiogram   CODE STATUS  -Verified with the son--full code        Past Medical History  Diagnosis Date  . Myelodysplastic syndrome, low grade 10/09/2011  . Anemia   . PONV (postoperative nausea and vomiting)    Past Surgical History  Procedure Laterality Date  . Abdominal hysterectomy    . Kidney stone removal     Social History:  reports that she has never smoked. She has never used smokeless tobacco. She reports that she does not drink alcohol or use illicit drugs.   History reviewed. No pertinent family history.   Allergies  Allergen Reactions  . Other Shortness Of Breath and Itching    Med for ???? Instructed patient to bring.  . Codeine Nausea And Vomiting      Prior to Admission medications   Medication Sig Start Date End Date Taking? Authorizing Provider  aspirin 325 MG tablet Take 162.5-325 mg by mouth every 6 (six) hours as  needed (For headache or pain.).   Yes Historical Provider, MD    Review of Systems:  Constitutional:  No weight loss, night sweats, Fevers, chills Head&Eyes: No headache.  No vision loss.  No eye pain or scotoma ENT:  No Difficulty  swallowing,Tooth/dental problems,Sore throat,  No ear ache, post nasal drip,  Cardio-vascular:  No chest pain, Orthopnea, PND, swelling in lower extremities,palpitations  GI:  No  abdominal pain, nausea, vomiting, diarrhea, loss of appetite, hematochezia, melena, heartburn, indigestion, Resp:  No shortness of breath with exertion or at rest. No cough. No coughing up of blood .No wheezing. Skin:  no rash or lesions.  GU:  no dysuria, change in color of urine, no urgency or frequency. No flank pain.  Musculoskeletal:  No joint pain or swelling. No decreased range of motion. No back pain.  Psych:  No change in mood or affect. No depression or anxiety. Neurologic: No headache, no dysesthesia, no focal weakness, no vision loss. No syncope  Physical Exam: Filed Vitals:   04/28/13 1415  BP: 122/69  Pulse: 90  Temp: 98 F (36.7 C)  Resp: 12  SpO2: 100%   General:  A&O x 2, NAD, nontoxic, pleasant/cooperative Head/Eye: No conjunctival hemorrhage, no icterus, [place]/AT, No nystagmus ENT:  No icterus,  No thrush, good dentition, no pharyngeal exudate Neck:  No masses, no lymphadenpathy, no bruits CV:  RRR, no rub, no gallop, no S3; 2/6 systolic murmur high pitched upper left sternal border  Lung:  CTAB, good air movement, no wheeze, no rhonchi Abdomen: soft/NT, +BS, nondistended, no peritoneal signs Ext: No cyanosis, No rashes, No petechiae, No lymphangitis, No edema Neuro: CNII-XII intact, strength 4/5 in bilateral upper and lower extremities, no dysmetria  Labs on Admission:  Basic Metabolic Panel:  Recent Labs Lab 04/28/13 1447  NA 139  K 3.3*  CL 104  CO2 27  GLUCOSE 97  BUN 11  CREATININE 0.44*  CALCIUM 9.1   Liver Function Tests:  Recent Labs Lab 04/28/13 1504  AST 20  ALT 11  ALKPHOS 78  BILITOT 1.4*  PROT 7.0  ALBUMIN 3.8   No results found for this basename: LIPASE, AMYLASE,  in the last 168 hours No results found for this basename: AMMONIA,  in the last  168 hours CBC:  Recent Labs Lab 04/28/13 1447  WBC 4.8  HGB 6.5*  HCT 17.1*  MCV 120.4*  PLT 325   Cardiac Enzymes: No results found for this basename: CKTOTAL, CKMB, CKMBINDEX, TROPONINI,  in the last 168 hours BNP: No components found with this basename: POCBNP,  CBG: No results found for this basename: GLUCAP,  in the last 168 hours  Radiological Exams on Admission: No results found.  EKG: Independently reviewed. pending    Time spent:70 minutes Code Status:   FULL Family Communication:   son at bedside   Kealani Leckey, DO  [facility] Pager [phone]  If 7PM-7AM, please contact night-coverage [email or web address] Password [facility] 04/28/2013, 5:56 PM

## 2013-04-28 NOTE — ED Notes (Signed)
Attempted to call report. RN on 4th floor can't take report at this time and will call back shortly to get report.

## 2013-04-28 NOTE — ED Provider Notes (Signed)
History     CSN: [id number]  Arrival date & time 04/28/13  1403   First MD Initiated Contact with Patient 04/28/13 1453      Chief Complaint  Patient presents with  . Anemia    (Consider location/radiation/quality/duration/timing/severity/associated sxs/prior treatment) HPI Comments: Pt with hx of Anemia - she has hx of Myelodysplastic syndrome (low grade).  Deneis bleeding, States hx of requiring shots for anemia but denies hx of anemia.  Today was sent to the hospital for blood transfusion - Had a low Hgb at the PCP's office Dr. Jeannetta Nap of 6.6 and sent to ED for transfusion.  She has had fatigue, confusion, no CP, SOB, HA, f, c, n, v, d.  Sx are constant, gradually worsening and have been present chronically.    Patient is a 79 y.o. female presenting with anemia. The history is provided by the patient and the spouse.  Anemia    Past Medical History  Diagnosis Date  . Myelodysplastic syndrome, low grade 10/09/2011  . Anemia   . PONV (postoperative nausea and vomiting)     Past Surgical History  Procedure Laterality Date  . Abdominal hysterectomy    . Kidney stone removal      History reviewed. No pertinent family history.  History  Substance Use Topics  . Smoking status: Never Smoker   . Smokeless tobacco: Never Used  . Alcohol Use: No    OB History   Grav Para Term Preterm Abortions TAB SAB Ect Mult Living                  Review of Systems  All other systems reviewed and are negative.    Allergies  Other and Codeine  Home Medications   Current Outpatient Rx  Name  Route  Sig  Dispense  Refill  . aspirin 325 MG tablet   Oral   Take 162.5-325 mg by mouth every 6 (six) hours as needed (For headache or pain.).           BP 122/69  Pulse 90  Temp(Src) 98 F (36.7 C)  Resp 12  SpO2 100%  Physical Exam  Nursing note and vitals reviewed. Constitutional: She appears well-developed and well-nourished. No distress.  HENT:  Head: Normocephalic  and atraumatic.  Mouth/Throat: Oropharynx is clear and moist. No oropharyngeal exudate.  Eyes: Conjunctivae and EOM are normal. Pupils are equal, round, and reactive to light. Right eye exhibits no discharge. Left eye exhibits no discharge. Scleral icterus ( scant) is present.  Neck: Normal range of motion. Neck supple. No JVD present. No thyromegaly present.  Cardiovascular: Normal rate, regular rhythm and intact distal pulses.  Exam reveals no gallop and no friction rub.   Murmur ( loud systolic) heard. Pulmonary/Chest: Effort normal and breath sounds normal. No respiratory distress. She has no wheezes. She has no rales.  Abdominal: Soft. Bowel sounds are normal. She exhibits no distension and no mass. There is no tenderness.  Genitourinary:  Chaperone present for rectal exam - has neg occult blood, no fissures or masses, soft brown stool present.  Musculoskeletal: Normal range of motion. She exhibits no edema and no tenderness.  Lymphadenopathy:    She has no cervical adenopathy.  Neurological: She is alert. Coordination normal.  Skin: Skin is warm and dry. No rash noted. No erythema.  Psychiatric: She has a normal mood and affect. Her behavior is normal.    ED Course  Procedures (including critical care time)  Labs Reviewed  CBC - Abnormal;  Notable for the following:    RBC 1.42 (*)    Hemoglobin 6.5 (*)    HCT 17.1 (*)    MCV 120.4 (*)    MCH 45.8 (*)    MCHC 38.0 (*)    RDW 29.1 (*)    All other components within normal limits  BASIC METABOLIC PANEL - Abnormal; Notable for the following:    Potassium 3.3 (*)    Creatinine, Ser 0.44 (*)    All other components within normal limits  HEPATIC FUNCTION PANEL - Abnormal; Notable for the following:    Total Bilirubin 1.4 (*)    Indirect Bilirubin 1.2 (*)    All other components within normal limits  SAMPLE TO BLOOD BANK  TYPE AND SCREEN  PREPARE RBC (CROSSMATCH)   No results found.   1. Anemia       MDM  Pt with  anemia - confirm, check for hemolytic process with labs, type and cross if Hgb confirmed low.  Otherwise in NAD with normal VS.  122/60, 83, 98% on ra.  No resp distress, RR of 15  Hgb confirmed low - macrocytic anemia, d/w Dr. Elisabeth Pigeon - transfusion ordered, pt to be admitted.       Vida Roller, MD 04/28/13 [phone]

## 2013-04-28 NOTE — ED Notes (Signed)
Pt states she has had blood transfusions before.  Pt states she used to take "shots" over at [facility] until December to prevent anemia.

## 2013-04-29 LAB — URINALYSIS W MICROSCOPIC + REFLEX CULTURE
Glucose, UA: NEGATIVE mg/dL
Hgb urine dipstick: NEGATIVE
Protein, ur: NEGATIVE mg/dL
pH: 7 (ref 5.0–8.0)

## 2013-04-29 LAB — HAPTOGLOBIN: Haptoglobin: <25 mg/dL — ABNORMAL LOW (ref 45–215)

## 2013-04-29 LAB — TYPE AND SCREEN
ABO/RH(D): O NEG
Antibody Screen: NEGATIVE
Unit division: 0

## 2013-04-29 LAB — CBC
Platelets: 250 10*3/uL (ref 150–400)
RBC: 2.06 MIL/uL — ABNORMAL LOW (ref 3.87–5.11)
WBC: 3.8 10*3/uL — ABNORMAL LOW (ref 4.0–10.5)

## 2013-04-29 LAB — AMMONIA: Ammonia: 38 umol/L (ref 11–60)

## 2013-04-29 LAB — RAPID URINE DRUG SCREEN, HOSP PERFORMED
Barbiturates: NOT DETECTED
Benzodiazepines: NOT DETECTED

## 2013-04-29 LAB — BASIC METABOLIC PANEL
CO2: 28 mEq/L (ref 19–32)
Calcium: 9 mg/dL (ref 8.4–10.5)
Chloride: 107 mEq/L (ref 96–112)
GFR calc Af Amer: >90 mL/min (ref >90)
Sodium: 142 mEq/L (ref 135–145)

## 2013-04-29 LAB — FERRITIN: Ferritin: 1326 ng/mL — ABNORMAL HIGH (ref 10–291)

## 2013-04-29 LAB — IRON AND TIBC
Iron: 52 ug/dL (ref 42–135)
TIBC: 213 ug/dL — ABNORMAL LOW (ref 250–470)
UIBC: 161 ug/dL (ref 125–400)

## 2013-04-29 LAB — LACTATE DEHYDROGENASE: LDH: 351 U/L — ABNORMAL HIGH (ref 94–250)

## 2013-04-29 LAB — MAGNESIUM: Magnesium: 2.3 mg/dL (ref 1.5–2.5)

## 2013-04-29 LAB — TSH: TSH: 2.336 u[IU]/mL (ref 0.350–4.500)

## 2013-04-29 MED ORDER — CYANOCOBALAMIN 1000 MCG/ML IJ SOLN
1000.0000 ug | Freq: Once | INTRAMUSCULAR | Status: AC
  Administered 2013-04-29: 1000 ug via INTRAMUSCULAR
  Filled 2013-04-29: qty 1

## 2013-04-29 MED ORDER — POTASSIUM CHLORIDE CRYS ER 20 MEQ PO TBCR
30.0000 meq | EXTENDED_RELEASE_TABLET | Freq: Once | ORAL | Status: AC
  Administered 2013-04-29: 30 meq via ORAL
  Filled 2013-04-29: qty 1

## 2013-04-29 MED ORDER — CYANOCOBALAMIN 1000 MCG PO TABS: 1000.0000 ug | ORAL_TABLET | Freq: Every day | ORAL | Status: DC

## 2013-04-29 MED ORDER — VITAMIN B-12 1000 MCG PO TABS: 1000.0000 ug | ORAL_TABLET | Freq: Every day | ORAL | Status: DC

## 2013-04-29 NOTE — Discharge Summary (Signed)
Physician Discharge Summary  Nicole Harvey ZOX:[id number] DOB: [birth year] DOA: 04/28/2013  PCP: Kaleen Mask, MD  Admit date: 04/28/2013 Discharge date: 04/29/2013  Recommendations for Outpatient Follow-up:  1. Pt will need to follow up with PCP in 1 week post discharge 2. Please obtain BMP to evaluate electrolytes and kidney function 3. Please also check CBC to evaluate Hg and Hct levels 4. Patient is to followup with hematology, Dr. Myna Hidalgo  Discharge Diagnoses:  Active Problems:   Myelodysplastic syndrome, low grade   Iron deficiency anemia   Acute encephalopathy   Pernicious anemia Acute encephalopathy  -Patient's mentation improved after transfusion, but continued to have some level of confusion -At the time of discharge, multiple labs were still pending including haptoglobin, TSH, serum B12, RBC folate, iron studies -I had a long discussion with the patient's son regarding the pending lab results and the patient's baseline confusion and risks of going home without further treatment and diagnostic workup -The patient's son understood the risks of going home without final information and workup, but due to consideration his father's dementia (father staying in the room) and the patient's desire to go home, he felt that he would be more prudent to followup with Dr. Jeannetta Nap for the lab results and further treatment -Given the son's history, the patient may have some underlying cognitive deficit  (Dementia)  -I explained to the patient and the son that he must report back to the emergency department if she has any symptoms of dizziness, chest discomfort, shortness of breath, hematochezia, or melena. The son also understood that he needs to call Dr. Hennie Duos office Monday for f/u  -Her acute worsening may be partly a result of her drop in hemoglobin but suspect possible other etiologies as the patient's hemoglobin and likely has made a gradual decline without any signs of active  bleeding  -Check UA --did not suggest UTI -Check serum B12, RBC folate --patient does have history of pernicious anemia--pending at the time of discharge  -Check TSH--pending -Urine drug screen--neg -ammonia--38 symptomatic anemia  -Hemoglobin 6.5 on the day of admission -hemoccult is negative -She received 2 units PRBCs--hemoglobin improved to 8.3 on the day of discharge -Patient with known iron deficiency and pernicious anemia  -Check iron studies prior to transfusion--pending at the time of discharge -LDH--351 -Haptoglobin--pending at time of discharge -Baseline hemoglobin 10-11  -Hemoglobin was 11.2 on 12/19/2012  Hypokalemia  -Repleted -Check magnesium--2.3  High-pitched systolic murmur/aortic stenosis -echocardiogram--confirmed severe aortic stenosis, EF 55-60% -This was discussed with the patient's son would prefer that this be followed up as an outpatient with Dr. Jeannetta Nap and possible cardiology outpt cardiology eval. CODE STATUS  -Verified with the son--full code   Discharge Condition: stable   Disposition: home with son  Diet:heart healthy Wt Readings from Last 3 Encounters:  04/28/13 50 kg (110 lb 3.7 oz)  10/18/12 50.349 kg (111 lb)  07/19/12 47.628 kg (105 lb)    History of present illness:  79 year old female with a history of myelodysplastic syndrome, pernicious anemia, iron deficiency anemia presents with one-week history of worsening confusion. The patient is currently confused and is an unreliable historian. Much of this history is obtained from speaking with the ED physician as well as the patient's son at the bedside. The patient went to see her primary care provider earlier on the day of admission for progressive fatigue over the past week. Routine bloodwork was performed and showed a low hemoglobin. She was called and told to come to the emergency department  for further evaluation. The patient's son states that the patient has had a gradual decline in  cognitive function over the past year, but it has been getting worse particularly in the last 1 week. The patient and her husband currently live with her son. As stated, the patient has had increasing fatigue over the past week, but she denies any chest discomfort, shortness of breath, nausea, vomiting, diarrhea, abdominal pain, tissue area, hematuria, syncope, or falls. She has had some dizziness. There is no headache or visual disturbance.  The patient previously followed Dr. Myna Hidalgo and received intermittent iron injections as well as Aranesp. However, the patient has not followed up in over 6 months. The patient denies any NSAID use or new medications. In emergency department, the patient was found to have a hemoglobin of 6.5. BMP was unremarkable except for potassium of 3.3. Hepatic panel was negative.     Discharge Exam: Filed Vitals:   04/29/13 1353  BP: 133/73  Pulse: 83  Temp: 97.7 F (36.5 C)  Resp: 18   Filed Vitals:   04/28/13 2355 04/29/13 0030 04/29/13 0700 04/29/13 1353  BP: 123/55 120/54 121/64 133/73  Pulse: 88 84 78 83  Temp: 97 F (36.1 C) 97.9 F (36.6 C) 97.6 F (36.4 C) 97.7 F (36.5 C)  TempSrc:   Oral Oral  Resp: 16 16 16 18   Height:      Weight:      SpO2:   97% 99%   General: A&O x 2, NAD, pleasant, cooperative Cardiovascular: RRR, no rub, no gallop, no S3 Respiratory: CTAB, no wheeze, no rhonchi Abdomen:soft, nontender, nondistended, positive bowel sounds Extremities: No edema, No lymphangitis, no petechiae  Discharge Instructions      Discharge Orders   Future Orders Complete By Expires     Diet - low sodium heart healthy  As directed     Discharge instructions  As directed     Comments:      Follow up with Dr. Jeannetta Nap on Monday 05/01/13    Increase activity slowly  As directed         Medication List    TAKE these medications       aspirin 325 MG tablet  Take 162.5-325 mg by mouth every 6 (six) hours as needed (For headache or pain.).      cyanocobalamin 1000 MCG tablet  Take 1 tablet (1,000 mcg total) by mouth daily.  Start taking on:  04/30/2013         The results of significant diagnostics from this hospitalization (including imaging, microbiology, ancillary and laboratory) are listed below for reference.    Significant Diagnostic Studies: No results found.   Microbiology: No results found for this or any previous visit (from the past 240 hour(s)).   Labs: Basic Metabolic Panel:  Recent Labs Lab 04/28/13 1447 04/29/13 0531  NA 139 142  K 3.3* 3.3*  CL 104 107  CO2 27 28  GLUCOSE 97 94  BUN 11 11  CREATININE 0.44* 0.42*  CALCIUM 9.1 9.0  MG  --  2.3   Liver Function Tests:  Recent Labs Lab 04/28/13 1504  AST 20  ALT 11  ALKPHOS 78  BILITOT 1.4*  PROT 7.0  ALBUMIN 3.8   No results found for this basename: LIPASE, AMYLASE,  in the last 168 hours  Recent Labs Lab 04/29/13 0531  AMMONIA 38   CBC:  Recent Labs Lab 04/28/13 1447 04/29/13 0531  WBC 4.8 3.8*  HGB 6.5* 8.3*  HCT  17.1* 23.2*  MCV 120.4* 112.6*  PLT 325 250   Cardiac Enzymes: No results found for this basename: CKTOTAL, CKMB, CKMBINDEX, TROPONINI,  in the last 168 hours BNP: No components found with this basename: POCBNP,  CBG: No results found for this basename: GLUCAP,  in the last 168 hours  Time coordinating discharge:  Greater than 30 minutes  Signed:  Neizan Debruhl, DO [facility] Pager: [phone] 04/29/2013, 3:29 PM

## 2013-04-29 NOTE — Progress Notes (Signed)
*  PRELIMINARY RESULTS* Echocardiogram 2D Echocardiogram has been performed.  Jeryl Columbia 04/29/2013, 10:54 AM

## 2013-05-05 ENCOUNTER — Telehealth: Payer: Self-pay | Admitting: Hematology & Oncology

## 2013-05-05 NOTE — Telephone Encounter (Signed)
Pt's son aware of 6-25 appointment and will have pt here.

## 2013-05-10 ENCOUNTER — Ambulatory Visit (HOSPITAL_BASED_OUTPATIENT_CLINIC_OR_DEPARTMENT_OTHER): Payer: Medicare Other | Admitting: Lab

## 2013-05-10 ENCOUNTER — Ambulatory Visit (HOSPITAL_BASED_OUTPATIENT_CLINIC_OR_DEPARTMENT_OTHER): Payer: Medicare Other | Admitting: Hematology & Oncology

## 2013-05-10 ENCOUNTER — Ambulatory Visit (HOSPITAL_BASED_OUTPATIENT_CLINIC_OR_DEPARTMENT_OTHER): Payer: Medicare Other

## 2013-05-10 VITALS — BP 97/71 | HR 85 | Temp 97.1°F | Resp 16 | Ht 65.0 in | Wt 111.0 lb

## 2013-05-10 DIAGNOSIS — D4620 Refractory anemia with excess of blasts, unspecified: Secondary | ICD-10-CM

## 2013-05-10 DIAGNOSIS — D509 Iron deficiency anemia, unspecified: Secondary | ICD-10-CM

## 2013-05-10 DIAGNOSIS — D510 Vitamin B12 deficiency anemia due to intrinsic factor deficiency: Secondary | ICD-10-CM

## 2013-05-10 LAB — CBC WITH DIFFERENTIAL (CANCER CENTER ONLY)
BASO%: 1.4 % (ref 0.0–2.0)
Eosinophils Absolute: 0.1 10*3/uL (ref 0.0–0.5)
LYMPH#: 0.5 10*3/uL — ABNORMAL LOW (ref 0.9–3.3)
MCV: 117 fL — ABNORMAL HIGH (ref 81–101)
MONO#: 0.4 10*3/uL (ref 0.1–0.9)
NEUT#: 4.1 10*3/uL (ref 1.5–6.5)
Platelets: 242 10*3/uL (ref 145–400)
RBC: 1.82 10*6/uL — ABNORMAL LOW (ref 3.70–5.32)
RDW: 27.2 % — ABNORMAL HIGH (ref 11.1–15.7)
WBC: 5.1 10*3/uL (ref 3.9–10.0)

## 2013-05-10 LAB — RETICULOCYTES (CHCC)
ABS Retic: 204 10*3/uL — ABNORMAL HIGH (ref 19.0–186.0)
Retic Ct Pct: 9.4 % — ABNORMAL HIGH (ref 0.4–2.3)

## 2013-05-10 LAB — TECHNOLOGIST REVIEW CHCC SATELLITE

## 2013-05-10 MED ORDER — DARBEPOETIN ALFA-POLYSORBATE 300 MCG/0.6ML IJ SOLN
300.0000 ug | Freq: Once | INTRAMUSCULAR | Status: AC
  Administered 2013-05-10: 300 ug via SUBCUTANEOUS

## 2013-05-10 NOTE — Progress Notes (Signed)
This office note has been dictated.

## 2013-05-10 NOTE — Patient Instructions (Signed)
Darbepoetin Alfa injection What is this medicine? DARBEPOETIN ALFA (dar be POE e tin AL fa) helps your body make more red blood cells. It is used to treat anemia caused by chronic kidney failure and chemotherapy. This medicine may be used for other purposes; ask your health care provider or pharmacist if you have questions. What should I tell my health care provider before I take this medicine? They need to know if you have any of these conditions: -blood clotting disorders or history of blood clots -cancer patient not on chemotherapy -cystic fibrosis -heart disease, such as angina, heart failure, or a history of a heart attack -hemoglobin level of 12 g/dL or greater -high blood pressure -low levels of folate, iron, or vitamin B12 -seizures -an unusual or allergic reaction to darbepoetin, erythropoietin, albumin, hamster proteins, latex, other medicines, foods, dyes, or preservatives -pregnant or trying to get pregnant -breast-feeding How should I use this medicine? This medicine is for injection into a vein or under the skin. It is usually given by a health care professional in a hospital or clinic setting. If you get this medicine at home, you will be taught how to prepare and give this medicine. Do not shake the solution before you withdraw a dose. Use exactly as directed. Take your medicine at regular intervals. Do not take your medicine more often than directed. It is important that you put your used needles and syringes in a special sharps container. Do not put them in a trash can. If you do not have a sharps container, call your pharmacist or healthcare provider to get one. Talk to your pediatrician regarding the use of this medicine in children. While this medicine may be used in children as young as 1 year for selected conditions, precautions do apply. Overdosage: If you think you have taken too much of this medicine contact a poison control center or emergency room at once. NOTE:  This medicine is only for you. Do not share this medicine with others. What if I miss a dose? If you miss a dose, take it as soon as you can. If it is almost time for your next dose, take only that dose. Do not take double or extra doses. What may interact with this medicine? Do not take this medicine with any of the following medications: -epoetin alfa This list may not describe all possible interactions. Give your health care provider a list of all the medicines, herbs, non-prescription drugs, or dietary supplements you use. Also tell them if you smoke, drink alcohol, or use illegal drugs. Some items may interact with your medicine. What should I watch for while using this medicine? Visit your prescriber or health care professional for regular checks on your progress and for the needed blood tests and blood pressure measurements. It is especially important for the doctor to make sure your hemoglobin level is in the desired range, to limit the risk of potential side effects and to give you the best benefit. Keep all appointments for any recommended tests. Check your blood pressure as directed. Ask your doctor what your blood pressure should be and when you should contact him or her. As your body makes more red blood cells, you may need to take iron, folic acid, or vitamin B supplements. Ask your doctor or health care provider which products are right for you. If you have kidney disease continue dietary restrictions, even though this medication can make you feel better. Talk with your doctor or health care professional about the   foods you eat and the vitamins that you take. What side effects may I notice from receiving this medicine? Side effects that you should report to your doctor or health care professional as soon as possible: -allergic reactions like skin rash, itching or hives, swelling of the face, lips, or tongue -breathing problems -changes in vision -chest pain -confusion, trouble speaking  or understanding -feeling faint or lightheaded, falls -high blood pressure -muscle aches or pains -pain, swelling, warmth in the leg -rapid weight gain -severe headaches -sudden numbness or weakness of the face, arm or leg -trouble walking, dizziness, loss of balance or coordination -seizures (convulsions) -swelling of the ankles, feet, hands -unusually weak or tired Side effects that usually do not require medical attention (report to your doctor or health care professional if they continue or are bothersome): -diarrhea -fever, chills (flu-like symptoms) -headaches -nausea, vomiting -redness, stinging, or swelling at site where injected This list may not describe all possible side effects. Call your doctor for medical advice about side effects. You may report side effects to FDA at 1-800-FDA-1088. Where should I keep my medicine? Keep out of the reach of children. Store in a refrigerator between 2 and 8 degrees C (36 and 46 degrees F). Do not freeze. Do not shake. Throw away any unused portion if using a single-dose vial. Throw away any unused medicine after the expiration date. NOTE: This sheet is a summary. It may not cover all possible information. If you have questions about this medicine, talk to your doctor, pharmacist, or health care provider.  2012, [employer]. (10/16/2008 10:23:57 AM) 

## 2013-05-14 NOTE — Progress Notes (Signed)
DIAGNOSIS: 1. Refractory anemia. 2. Pernicious anemia. 3. Intermittent iron deficiency anemia. 4. Progressive dementia.  CURRENT THERAPY: 1. Aranesp 300 mcg subcu as needed for hemoglobin less than 11. 2. Vitamin B12 1 mg IM every monthly (done at her family doctor's     office). 3. IV iron as indicated.  INTERIM HISTORY:  Ms. Dowen comes in for a long awaited followup. We last saw her back in December.  She last got Aranesp back in January. Unfortunately, dementia continues to worsen with her.  She does not realize that she has a blood problem.  She did not realize that she had 2 units of blood last week.  She is not aware of who her family doctor is.  She lives with her son.  Thankfully, her son came with her today. We were never called by Dr. Jeannetta Nap that her hemoglobin was down to 5. Again, she has not seen [place] for about 6 months.  She has refused to let us do a bone marrow test on her.  Again, I suspect that she has refractory anemia.  I do review her blood smear every time we see her. I do not see any evidence of transformation to an acute leukemic process. She looks okay.  She is eating okay.  She has had no bleeding.  She has had no change in bowel or bladder habits.  She has had some leg swelling.  As far as I can tell, there has been no change in medications.  PHYSICAL EXAMINATION:  General:  This is an elderly, somewhat frail- appearing white female in no obvious distress.  Vital signs: Temperature of 97.1, pulse 85, respiratory rate 16, blood pressure 97/71.  Weight is 111 pounds.  Head and neck:  Normocephalic, atraumatic skull.  There are no ocular or oral lesions.  There are no palpable cervical or supraclavicular lymph nodes.  Lungs:  Clear bilaterally. Cardiac:  Regular rate and rhythm with an occasional extra beat.  She has a 1/6 systolic ejection murmur.  Abdomen:  Soft.  She has good bowel sounds.  There was no fluid wave.  There was no guarding or  rebound tenderness.  There was no palpable hepatosplenomegaly.  Extremities: Show no clubbing, cyanosis or edema.  Neurological:  Shows no focal neurological deficits.  LABORATORY STUDIES:  White cell count is 5.1, hemoglobin 8, hematocrit 21.2, platelet count 242,000.  MCV is 117. Peripheral smear shows some mild anisocytosis and poikilocytosis.  No nucleated red cells were noted.  She has no schistocytes.  There are some spherocytes.  No rouleaux formation is appreciated.  White cells appear normal in morphology and maturation.  I do not see any immature myeloid cells.  There are no blasts.  There may be a slight increase in monocytes.  Platelets are adequate in number and size.  IMPRESSION:  Ms. Jaskowiak is an 79 year old white female with refractory anemia.  Again, she will not let us do a bone marrow test on her so that we can confirm the degree of myelodysplasia within the bone marrow.  In addition, getting a bone marrow so that we can do cytogenetics would also help [place] out. For now, we will just "look at the big picture" and just try to support Ms. Hemmerich as best we can.  I think that as long as her hemoglobin stays above 9 or 10, she should be doing okay.  She is not all that active.  Her dementia is getting worse.  I want to make sure that  we do not intervene to aggressively and cause more hardship for her and her family.  We will make sure she comes back every 3 weeks for her CBC and Aranesp.  I will see her back in 6 weeks.    ______________________________ Josph Macho, M.D. PRE/MEDQ  D:  05/12/2013  T:  05/14/2013  Job:  [id number]

## 2013-05-31 ENCOUNTER — Other Ambulatory Visit (HOSPITAL_BASED_OUTPATIENT_CLINIC_OR_DEPARTMENT_OTHER): Payer: Medicare Other | Admitting: Lab

## 2013-05-31 ENCOUNTER — Ambulatory Visit (HOSPITAL_BASED_OUTPATIENT_CLINIC_OR_DEPARTMENT_OTHER): Payer: Medicare Other

## 2013-05-31 ENCOUNTER — Other Ambulatory Visit: Payer: Self-pay | Admitting: Medical

## 2013-05-31 ENCOUNTER — Encounter (HOSPITAL_COMMUNITY)
Admission: RE | Admit: 2013-05-31 | Discharge: 2013-05-31 | Disposition: A | Discharge status: Home / Self Care | Payer: Medicare Other | Source: Ambulatory Visit | Attending: Hematology & Oncology | Admitting: Hematology & Oncology

## 2013-05-31 VITALS — BP 106/52 | HR 82 | Temp 98.2°F

## 2013-05-31 DIAGNOSIS — D649 Anemia, unspecified: Secondary | ICD-10-CM | POA: Insufficient documentation

## 2013-05-31 DIAGNOSIS — D4620 Refractory anemia with excess of blasts, unspecified: Secondary | ICD-10-CM

## 2013-05-31 LAB — HOLD TUBE, BLOOD BANK - CHCC SATELLITE

## 2013-05-31 LAB — PREPARE RBC (CROSSMATCH)

## 2013-05-31 LAB — CBC WITH DIFFERENTIAL (CANCER CENTER ONLY)
BASO#: 0.1 10*3/uL (ref 0.0–0.2)
EOS%: 1.9 % (ref 0.0–7.0)
HCT: 20.6 % — ABNORMAL LOW (ref 34.8–46.6)
HGB: 7.4 g/dL — ABNORMAL LOW (ref 11.6–15.9)
LYMPH#: 0.5 10*3/uL — ABNORMAL LOW (ref 0.9–3.3)
MCH: 43.5 pg — ABNORMAL HIGH (ref 26.0–34.0)
MCHC: 35.9 g/dL (ref 32.0–36.0)
MCV: 121 fL — ABNORMAL HIGH (ref 81–101)
NEUT%: 81.1 % — ABNORMAL HIGH (ref 39.6–80.0)

## 2013-05-31 MED ORDER — DARBEPOETIN ALFA-POLYSORBATE 300 MCG/0.6ML IJ SOLN
300.0000 ug | Freq: Once | INTRAMUSCULAR | Status: AC
  Administered 2013-05-31: 300 ug via SUBCUTANEOUS

## 2013-05-31 NOTE — Patient Instructions (Signed)
Darbepoetin Alfa injection What is this medicine? DARBEPOETIN ALFA (dar be POE e tin AL fa) helps your body make more red blood cells. It is used to treat anemia caused by chronic kidney failure and chemotherapy. This medicine may be used for other purposes; ask your health care provider or pharmacist if you have questions. What should I tell my health care provider before I take this medicine? They need to know if you have any of these conditions: -blood clotting disorders or history of blood clots -cancer patient not on chemotherapy -cystic fibrosis -heart disease, such as angina, heart failure, or a history of a heart attack -hemoglobin level of 12 g/dL or greater -high blood pressure -low levels of folate, iron, or vitamin B12 -seizures -an unusual or allergic reaction to darbepoetin, erythropoietin, albumin, hamster proteins, latex, other medicines, foods, dyes, or preservatives -pregnant or trying to get pregnant -breast-feeding How should I use this medicine? This medicine is for injection into a vein or under the skin. It is usually given by a health care professional in a hospital or clinic setting. If you get this medicine at home, you will be taught how to prepare and give this medicine. Do not shake the solution before you withdraw a dose. Use exactly as directed. Take your medicine at regular intervals. Do not take your medicine more often than directed. It is important that you put your used needles and syringes in a special sharps container. Do not put them in a trash can. If you do not have a sharps container, call your pharmacist or healthcare provider to get one. Talk to your pediatrician regarding the use of this medicine in children. While this medicine may be used in children as young as 1 year for selected conditions, precautions do apply. Overdosage: If you think you have taken too much of this medicine contact a poison control center or emergency room at once. NOTE:  This medicine is only for you. Do not share this medicine with others. What if I miss a dose? If you miss a dose, take it as soon as you can. If it is almost time for your next dose, take only that dose. Do not take double or extra doses. What may interact with this medicine? Do not take this medicine with any of the following medications: -epoetin alfa This list may not describe all possible interactions. Give your health care provider a list of all the medicines, herbs, non-prescription drugs, or dietary supplements you use. Also tell them if you smoke, drink alcohol, or use illegal drugs. Some items may interact with your medicine. What should I watch for while using this medicine? Visit your prescriber or health care professional for regular checks on your progress and for the needed blood tests and blood pressure measurements. It is especially important for the doctor to make sure your hemoglobin level is in the desired range, to limit the risk of potential side effects and to give you the best benefit. Keep all appointments for any recommended tests. Check your blood pressure as directed. Ask your doctor what your blood pressure should be and when you should contact him or her. As your body makes more red blood cells, you may need to take iron, folic acid, or vitamin B supplements. Ask your doctor or health care provider which products are right for you. If you have kidney disease continue dietary restrictions, even though this medication can make you feel better. Talk with your doctor or health care professional about the   foods you eat and the vitamins that you take. What side effects may I notice from receiving this medicine? Side effects that you should report to your doctor or health care professional as soon as possible: -allergic reactions like skin rash, itching or hives, swelling of the face, lips, or tongue -breathing problems -changes in vision -chest pain -confusion, trouble speaking  or understanding -feeling faint or lightheaded, falls -high blood pressure -muscle aches or pains -pain, swelling, warmth in the leg -rapid weight gain -severe headaches -sudden numbness or weakness of the face, arm or leg -trouble walking, dizziness, loss of balance or coordination -seizures (convulsions) -swelling of the ankles, feet, hands -unusually weak or tired Side effects that usually do not require medical attention (report to your doctor or health care professional if they continue or are bothersome): -diarrhea -fever, chills (flu-like symptoms) -headaches -nausea, vomiting -redness, stinging, or swelling at site where injected This list may not describe all possible side effects. Call your doctor for medical advice about side effects. You may report side effects to FDA at 1-800-FDA-1088. Where should I keep my medicine? Keep out of the reach of children. Store in a refrigerator between 2 and 8 degrees C (36 and 46 degrees F). Do not freeze. Do not shake. Throw away any unused portion if using a single-dose vial. Throw away any unused medicine after the expiration date. NOTE: This sheet is a summary. It may not cover all possible information. If you have questions about this medicine, talk to your doctor, pharmacist, or health care provider.  2013, [employer]. (10/16/2008 10:23:57 AM)  

## 2013-06-01 ENCOUNTER — Ambulatory Visit (HOSPITAL_BASED_OUTPATIENT_CLINIC_OR_DEPARTMENT_OTHER): Payer: Medicare Other

## 2013-06-01 VITALS — BP 110/68 | HR 70 | Temp 98.6°F

## 2013-06-01 DIAGNOSIS — Q068 Other specified congenital malformations of spinal cord: Secondary | ICD-10-CM

## 2013-06-01 MED ORDER — DIPHENHYDRAMINE HCL 25 MG PO CAPS
25.0000 mg | ORAL_CAPSULE | Freq: Once | ORAL | Status: AC
  Administered 2013-06-01: 25 mg via ORAL

## 2013-06-01 MED ORDER — ACETAMINOPHEN 325 MG PO TABS
650.0000 mg | ORAL_TABLET | Freq: Once | ORAL | Status: AC
  Administered 2013-06-01: 650 mg via ORAL

## 2013-06-01 NOTE — Patient Instructions (Addendum)
Blood Transfusion Information WHAT IS A BLOOD TRANSFUSION? A transfusion is the replacement of blood or some of its parts. Blood is made up of multiple cells which provide different functions.  Red blood cells carry oxygen and are used for blood loss replacement.  White blood cells fight against infection.  Platelets control bleeding.  Plasma helps clot blood.  Other blood products are available for specialized needs, such as hemophilia or other clotting disorders. BEFORE THE TRANSFUSION  Who gives blood for transfusions?   You may be able to donate blood to be used at a later date on yourself (autologous donation).  Relatives can be asked to donate blood. This is generally not any safer than if you have received blood from a stranger. The same precautions are taken to ensure safety when a relative's blood is donated.  Healthy volunteers who are fully evaluated to make sure their blood is safe. This is blood bank blood. Transfusion therapy is the safest it has ever been in the practice of medicine. Before blood is taken from a donor, a complete history is taken to make sure that person has no history of diseases nor engages in risky social behavior (examples are intravenous drug use or sexual activity with multiple partners). The donor's travel history is screened to minimize risk of transmitting infections, such as malaria. The donated blood is tested for signs of infectious diseases, such as HIV and hepatitis. The blood is then tested to be sure it is compatible with you in order to minimize the chance of a transfusion reaction. If you or a relative donates blood, this is often done in anticipation of surgery and is not appropriate for emergency situations. It takes many days to process the donated blood. RISKS AND COMPLICATIONS Although transfusion therapy is very safe and saves many lives, the main dangers of transfusion include:   Getting an infectious disease.  Developing a  transfusion reaction. This is an allergic reaction to something in the blood you were given. Every precaution is taken to prevent this. The decision to have a blood transfusion has been considered carefully by your caregiver before blood is given. Blood is not given unless the benefits outweigh the risks. AFTER THE TRANSFUSION  Right after receiving a blood transfusion, you will usually feel much better and more energetic. This is especially true if your red blood cells have gotten low (anemic). The transfusion raises the level of the red blood cells which carry oxygen, and this usually causes an energy increase.  The nurse administering the transfusion will monitor you carefully for complications. HOME CARE INSTRUCTIONS  No special instructions are needed after a transfusion. You may find your energy is better. Speak with your caregiver about any limitations on activity for underlying diseases you may have. SEEK MEDICAL CARE IF:   Your condition is not improving after your transfusion.  You develop redness or irritation at the intravenous (IV) site. SEEK IMMEDIATE MEDICAL CARE IF:  Any of the following symptoms occur over the next 12 hours:  Shaking chills.  You have a temperature by mouth above 102 F (38.9 C), not controlled by medicine.  Chest, back, or muscle pain.  People around you feel you are not acting correctly or are confused.  Shortness of breath or difficulty breathing.  Dizziness and fainting.  You get a rash or develop hives.  You have a decrease in urine output.  Your urine turns a dark color or changes to pink, red, or brown. Any of the following   symptoms occur over the next 10 days:  You have a temperature by mouth above 102 F (38.9 C), not controlled by medicine.  Shortness of breath.  Weakness after normal activity.  The white part of the eye turns yellow (jaundice).  You have a decrease in the amount of urine or are urinating less often.  Your  urine turns a dark color or changes to pink, red, or brown. Document Released: 10/30/2000 Document Revised: 01/25/2012 Document Reviewed: 06/18/2008 ExitCare Patient Information 2014 [employer].  

## 2013-06-02 LAB — TYPE AND SCREEN
ABO/RH(D): O NEG
Antibody Screen: NEGATIVE
Unit division: 0

## 2013-06-22 ENCOUNTER — Other Ambulatory Visit (HOSPITAL_BASED_OUTPATIENT_CLINIC_OR_DEPARTMENT_OTHER): Payer: Medicare Other | Admitting: Lab

## 2013-06-22 ENCOUNTER — Ambulatory Visit: Payer: Medicare Other

## 2013-06-22 ENCOUNTER — Ambulatory Visit (HOSPITAL_BASED_OUTPATIENT_CLINIC_OR_DEPARTMENT_OTHER): Payer: Medicare Other | Admitting: Hematology & Oncology

## 2013-06-22 VITALS — BP 99/53 | HR 84 | Temp 98.3°F | Resp 16 | Ht 65.0 in | Wt 110.0 lb

## 2013-06-22 DIAGNOSIS — D4620 Refractory anemia with excess of blasts, unspecified: Secondary | ICD-10-CM

## 2013-06-22 DIAGNOSIS — D509 Iron deficiency anemia, unspecified: Secondary | ICD-10-CM

## 2013-06-22 DIAGNOSIS — D510 Vitamin B12 deficiency anemia due to intrinsic factor deficiency: Secondary | ICD-10-CM

## 2013-06-22 DIAGNOSIS — D46Z Other myelodysplastic syndromes: Secondary | ICD-10-CM

## 2013-06-22 LAB — CBC WITH DIFFERENTIAL (CANCER CENTER ONLY)
Eosinophils Absolute: 0.1 10*3/uL (ref 0.0–0.5)
HCT: 21.8 % — ABNORMAL LOW (ref 34.8–46.6)
HGB: 7.9 g/dL — ABNORMAL LOW (ref 11.6–15.9)
LYMPH%: 9.6 % — ABNORMAL LOW (ref 14.0–48.0)
MONO#: 0.3 10*3/uL (ref 0.1–0.9)
Platelets: 271 10*3/uL (ref 145–400)
WBC: 5.5 10*3/uL (ref 3.9–10.0)

## 2013-06-22 LAB — CHCC SATELLITE - SMEAR

## 2013-06-22 LAB — IRON AND TIBC CHCC
%SAT: 23 % (ref 21–57)
UIBC: 177 ug/dL (ref 120–384)

## 2013-06-22 LAB — RETICULOCYTES (CHCC): ABS Retic: 215.8 10*3/uL — ABNORMAL HIGH (ref 19.0–186.0)

## 2013-06-22 LAB — FERRITIN CHCC: Ferritin: 1936 ng/ml — ABNORMAL HIGH (ref 9–269)

## 2013-06-22 NOTE — Progress Notes (Signed)
This office note has been dictated.

## 2013-06-22 NOTE — Progress Notes (Signed)
No treatment today per dr. ennever 

## 2013-06-23 ENCOUNTER — Encounter (HOSPITAL_COMMUNITY)
Admission: RE | Admit: 2013-06-23 | Discharge: 2013-06-23 | Disposition: A | Discharge status: Home / Self Care | Payer: Medicare Other | Source: Ambulatory Visit | Attending: Hematology & Oncology | Admitting: Hematology & Oncology

## 2013-06-23 DIAGNOSIS — D510 Vitamin B12 deficiency anemia due to intrinsic factor deficiency: Secondary | ICD-10-CM | POA: Insufficient documentation

## 2013-06-23 DIAGNOSIS — D509 Iron deficiency anemia, unspecified: Secondary | ICD-10-CM

## 2013-06-23 DIAGNOSIS — D46Z Other myelodysplastic syndromes: Secondary | ICD-10-CM

## 2013-06-23 NOTE — Progress Notes (Signed)
CC:   Nicole Harvey, M.D.  DIAGNOSES: 1. Refractory anemia. 2. Progressive dementia.  CURRENT THERAPY:  Supportive care with transfusions.  INTERIM HISTORY:  Nicole Harvey comes in for followup.  Unfortunately, all of our efforts to try to improve her blood counts just have not succeeded.  I just do not want to continue to treat her with ineffective therapy.  When we saw her back in June, her ferritin was 1326.  Iron saturation was 24%.  Her B12 level was 286.  She had normal renal function.  She just has a dysfunctional marrow.  Given her dementia, I really think that supportive care is what we really need to focus on.  Her son is with her, and he agrees.  Her blood count continues to trend downward.  She does not really need a bone marrow test in my point of view.  I am just not sure what we would find that we would be able to treat if we did a bone marrow test on her.  PHYSICAL EXAMINATION:  General:  This is an elderly, thin white female in no obvious distress.  Vital signs:  Show a temperature of 98.3, pulse 84, respiratory rate 16, blood pressure 99/53.  Weight is 110 pounds. Head and neck:  Shows a normocephalic, atraumatic skull.  There are no ocular or oral lesions.  There are no palpable cervical or supraclavicular lymph nodes.  Lungs:  Clear bilaterally.  Cardiac: Regular rate and rhythm with a normal S1 and S2.  She has a 2/6 systolic ejection murmur.  Abdomen:  Soft.  She has good bowel sounds.  There is no fluid wave.  There is no palpable hepatosplenomegaly.  Extremities: Show osteoarthritic changes in her joints.  She has some age-related muscle atrophy in upper and lower extremities.  Skin:  Shows some scattered ecchymoses.  Neurological:  Shows no focal neurological deficits.  LABORATORY STUDIES:  White cell count is 5.5, hemoglobin 7.9, hematocrit 21.8, platelet count 271.  MCV is 119.  IMPRESSION:  Nicole Harvey is an 79 year old white female with  refractory anemia.  Again, we have tried her on Aranesp but it appears this still has not helped.  She is getting B12 monthly which certainly could continue.  Will go ahead and give her a transfusion next week.  I suspect that she probably will need one at that point in time.  We will plan to get her back to see [place] in about 3 weeks or so.    ______________________________ Josph Macho, M.D. PRE/MEDQ  D:  06/22/2013  T:  06/23/2013  Job:  [id number]

## 2013-06-27 ENCOUNTER — Other Ambulatory Visit (HOSPITAL_BASED_OUTPATIENT_CLINIC_OR_DEPARTMENT_OTHER): Payer: Medicare Other | Admitting: Lab

## 2013-06-27 DIAGNOSIS — D509 Iron deficiency anemia, unspecified: Secondary | ICD-10-CM

## 2013-06-27 DIAGNOSIS — D4620 Refractory anemia with excess of blasts, unspecified: Secondary | ICD-10-CM

## 2013-06-27 DIAGNOSIS — D510 Vitamin B12 deficiency anemia due to intrinsic factor deficiency: Secondary | ICD-10-CM

## 2013-06-27 DIAGNOSIS — D46Z Other myelodysplastic syndromes: Secondary | ICD-10-CM

## 2013-06-27 LAB — CBC WITH DIFFERENTIAL (CANCER CENTER ONLY)
BASO#: 0.1 10*3/uL (ref 0.0–0.2)
Eosinophils Absolute: 0.1 10*3/uL (ref 0.0–0.5)
HCT: 22.4 % — ABNORMAL LOW (ref 34.8–46.6)
HGB: 7.5 g/dL — ABNORMAL LOW (ref 11.6–15.9)
LYMPH%: 6.6 % — ABNORMAL LOW (ref 14.0–48.0)
MCH: 37.7 pg — ABNORMAL HIGH (ref 26.0–34.0)
MCV: 113 fL — ABNORMAL HIGH (ref 81–101)
MONO%: 4.7 % (ref 0.0–13.0)
NEUT%: 86.5 % — ABNORMAL HIGH (ref 39.6–80.0)
Platelets: 288 10*3/uL (ref 145–400)
RBC: 1.99 10*6/uL — ABNORMAL LOW (ref 3.70–5.32)

## 2013-06-28 ENCOUNTER — Ambulatory Visit (HOSPITAL_BASED_OUTPATIENT_CLINIC_OR_DEPARTMENT_OTHER): Payer: Medicare Other

## 2013-06-28 ENCOUNTER — Encounter: Payer: Self-pay | Admitting: Hematology & Oncology

## 2013-06-28 VITALS — BP 121/68 | HR 75 | Temp 97.0°F | Resp 20

## 2013-06-28 DIAGNOSIS — D46Z Other myelodysplastic syndromes: Secondary | ICD-10-CM

## 2013-06-28 LAB — PREPARE RBC (CROSSMATCH)

## 2013-06-28 MED ORDER — FOLIC ACID 1 MG PO TABS: 2.0000 mg | ORAL_TABLET | Freq: Every day | ORAL | Status: DC

## 2013-06-28 MED ORDER — ACETAMINOPHEN 325 MG PO TABS
650.0000 mg | ORAL_TABLET | Freq: Once | ORAL | Status: AC
  Administered 2013-06-28: 650 mg via ORAL

## 2013-06-28 MED ORDER — PANTOPRAZOLE SODIUM 40 MG PO TBEC: 40.0000 mg | DELAYED_RELEASE_TABLET | Freq: Every day | ORAL | Status: DC

## 2013-06-28 MED ORDER — FUROSEMIDE 10 MG/ML IJ SOLN: 10.0000 mg | Freq: Once | INTRAMUSCULAR | Status: DC

## 2013-06-28 MED ORDER — SODIUM CHLORIDE 0.9 % IV SOLN
250.0000 mL | Freq: Once | INTRAVENOUS | Status: AC
  Administered 2013-06-28: 250 mL via INTRAVENOUS

## 2013-06-28 MED ORDER — PREDNISONE 20 MG PO TABS: ORAL_TABLET | ORAL | Status: DC

## 2013-06-28 NOTE — Progress Notes (Signed)
This office note has been dictated.

## 2013-06-28 NOTE — Patient Instructions (Signed)
Blood Transfusion  A blood transfusion replaces your blood or some of its parts. Blood is replaced when you have lost blood because of surgery, an accident, or for severe blood conditions like anemia. You can donate blood to be used on yourself if you have a planned surgery. If you lose blood during that surgery, your own blood can be given back to you. Any blood given to you is checked to make sure it matches your blood type. Your temperature, blood pressure, and heart rate (vital signs) will be checked often.  GET HELP RIGHT AWAY IF:   You feel sick to your stomach (nauseous) or throw up (vomit).  You have watery poop (diarrhea).  You have shortness of breath or trouble breathing.  You have blood in your pee (urine) or have dark colored pee.  You have chest pain or tightness.  Your eyes or skin turn yellow (jaundice).  You have a temperature by mouth above 102 F (38.9 C), not controlled by medicine.  You start to shake and have chills.  You develop a a red rash (hives) or feel itchy.  You develop lightheadedness or feel confused.  You develop back, joint, or muscle pain.  You do not feel hungry (lost appetite).  You feel tired, restless, or nervous.  You develop belly (abdominal) cramps. Document Released: 01/29/2009 Document Revised: 01/25/2012 Document Reviewed: 01/29/2009 ExitCare Patient Information 2014 [employer].  

## 2013-06-28 NOTE — Addendum Note (Signed)
Addended by: Arlan Organ R on: 06/28/2013 05:57 PM   Modules accepted: Orders, Level of Service

## 2013-06-28 NOTE — Addendum Note (Signed)
Addended by: Arlan Organ R on: 06/28/2013 02:52 PM   Modules accepted: Orders

## 2013-06-29 LAB — TYPE AND SCREEN: Unit division: 0

## 2013-06-29 LAB — LACTATE DEHYDROGENASE: LDH: 395 U/L — ABNORMAL HIGH (ref 94–250)

## 2013-06-29 LAB — RETICULOCYTES (CHCC): RBC.: 2.09 MIL/uL — ABNORMAL LOW (ref 3.87–5.11)

## 2013-06-29 LAB — HAPTOGLOBIN: Haptoglobin: <25 mg/dL — ABNORMAL LOW (ref 45–215)

## 2013-06-29 NOTE — Progress Notes (Signed)
DIAGNOSIS:  Autoimmune hemolytic anemia.  CURRENT THERAPY:  Patient to start prednisone 60 mg p.o. daily.  Ms. Nicole Harvey was in today getting a blood transfusion.  She has a dropping hemoglobin.  Yesterday, her hemoglobin was 7.5.  Her MCV was quite high at 113.  We ran some additional lab work on her.  She has a high retic count of 8%.  Her haptoglobin was less than 25.  Her LDH is 395.  She has a positive direct Coombs test.  As such, I think that we are dealing with an autoimmune process.  One has to suspect an autoimmune hemolytic anemia.  We have not really been able to do any extensive studies on her because of her dementia.  She has not allowed [place] do a bone marrow biopsy on her.  I spoke to her son at length.  I told him that I thought that we might be able to deal with this and try to improve it.  Again, she has an autoimmune hemolytic anemia.  I cannot find anything on her physical exam that would suggest an underlying cause for this. As such, this certainly could be idiopathic.  Again, she has a bad dementia and really does not grasp what we tell her.  She has had no fever.  There has been no weight loss.  Her appetite seems to be doing fairly well.  PHYSICAL EXAMINATION:  She has stable vital signs.  Her blood pressure is 121/68.  Temperature 97.  There is no splenomegaly on her exam. There is no lymphadenopathy.  There is no hepatomegaly.  Skin shows no obvious rashes.  For now, we will try her on prednisone.  I gave the prescription to her son.  I want her to take 60 mg a day.  I told him to make sure that she takes it with food.  I also want to get her on folic acid.  I will have to make sure that we call this in to her pharmacy.  I need to make sure that we check her CBC weekly.  I think if she does not respond to prednisone, then we may want to consider IVIG.  I suppose we also could consider Rituxan.  Ultimately, we may have to do a bone marrow biopsy  on her.  Again, she has been very reluctant to have this done.  I am just surprised that we are now discovering this.  So far, we have never been able to uncover a cause for the anemia.  I think we now have a source and, hopefully, we can correct this and make her life better and not have to keep transfusing her.  Again, she will come in weekly for her lab work.  I will plan to see her back in about 3 weeks or so.    ______________________________ Josph Macho, M.D. PRE/MEDQ  D:  06/28/2013  T:  06/29/2013  Job:  [id number]

## 2013-07-05 ENCOUNTER — Other Ambulatory Visit (HOSPITAL_BASED_OUTPATIENT_CLINIC_OR_DEPARTMENT_OTHER): Payer: Medicare Other | Admitting: Lab

## 2013-07-05 DIAGNOSIS — D46Z Other myelodysplastic syndromes: Secondary | ICD-10-CM

## 2013-07-05 DIAGNOSIS — D4620 Refractory anemia with excess of blasts, unspecified: Secondary | ICD-10-CM

## 2013-07-05 DIAGNOSIS — D591 Autoimmune hemolytic anemia, unspecified: Secondary | ICD-10-CM

## 2013-07-05 DIAGNOSIS — IMO0001 Reserved for inherently not codable concepts without codable children: Secondary | ICD-10-CM

## 2013-07-05 LAB — CBC WITH DIFFERENTIAL (CANCER CENTER ONLY)
BASO#: 0 10*3/uL (ref 0.0–0.2)
BASO%: 0.4 % (ref 0.0–2.0)
EOS%: 0.4 % (ref 0.0–7.0)
HCT: 25.7 % — ABNORMAL LOW (ref 34.8–46.6)
HGB: 9.5 g/dL — ABNORMAL LOW (ref 11.6–15.9)
LYMPH#: 1 10*3/uL (ref 0.9–3.3)
LYMPH%: 12.8 % — ABNORMAL LOW (ref 14.0–48.0)
MCH: 41.9 pg — ABNORMAL HIGH (ref 26.0–34.0)
MCHC: 37 g/dL — ABNORMAL HIGH (ref 32.0–36.0)
MCV: 113 fL — ABNORMAL HIGH (ref 81–101)
MONO%: 9.7 % (ref 0.0–13.0)
NEUT%: 76.7 % (ref 39.6–80.0)
RDW: 24.5 % — ABNORMAL HIGH (ref 11.1–15.7)

## 2013-07-05 LAB — RETICULOCYTES (CHCC): ABS Retic: 214.3 10*3/uL — ABNORMAL HIGH (ref 19.0–186.0)

## 2013-07-10 ENCOUNTER — Telehealth: Payer: Self-pay | Admitting: *Deleted

## 2013-07-10 NOTE — Telephone Encounter (Signed)
Message copied by Anselm Jungling on Mon Jul 10, 2013  2:07 PM ------      Message from: Arlan Organ R      Created: Thu Jul 06, 2013  9:07 PM       Call her son - her blood is starting to come up a little!!! Keep on the prednisone. Pete ------

## 2013-07-10 NOTE — Telephone Encounter (Signed)
Called patients son to let him know her blood counts are starting to come up a little.  Stay on the prednisone!

## 2013-07-12 ENCOUNTER — Other Ambulatory Visit (HOSPITAL_BASED_OUTPATIENT_CLINIC_OR_DEPARTMENT_OTHER): Payer: Medicare Other | Admitting: Lab

## 2013-07-12 DIAGNOSIS — D4620 Refractory anemia with excess of blasts, unspecified: Secondary | ICD-10-CM

## 2013-07-12 DIAGNOSIS — D509 Iron deficiency anemia, unspecified: Secondary | ICD-10-CM

## 2013-07-12 DIAGNOSIS — D46Z Other myelodysplastic syndromes: Secondary | ICD-10-CM

## 2013-07-12 DIAGNOSIS — D510 Vitamin B12 deficiency anemia due to intrinsic factor deficiency: Secondary | ICD-10-CM

## 2013-07-12 LAB — CBC WITH DIFFERENTIAL (CANCER CENTER ONLY)
BASO#: 0 10*3/uL (ref 0.0–0.2)
EOS%: 0.1 % (ref 0.0–7.0)
Eosinophils Absolute: 0 10*3/uL (ref 0.0–0.5)
HGB: 9.7 g/dL — ABNORMAL LOW (ref 11.6–15.9)
LYMPH%: 6.9 % — ABNORMAL LOW (ref 14.0–48.0)
MCH: 37 pg — ABNORMAL HIGH (ref 26.0–34.0)
MCHC: 33.9 g/dL (ref 32.0–36.0)
MCV: 109 fL — ABNORMAL HIGH (ref 81–101)
MONO%: 5.6 % (ref 0.0–13.0)
Platelets: 234 10*3/uL (ref 145–400)
RBC: 2.62 10*6/uL — ABNORMAL LOW (ref 3.70–5.32)

## 2013-07-19 ENCOUNTER — Ambulatory Visit (HOSPITAL_BASED_OUTPATIENT_CLINIC_OR_DEPARTMENT_OTHER): Payer: Medicare Other | Admitting: Hematology & Oncology

## 2013-07-19 ENCOUNTER — Other Ambulatory Visit (HOSPITAL_BASED_OUTPATIENT_CLINIC_OR_DEPARTMENT_OTHER): Payer: Medicare Other | Admitting: Lab

## 2013-07-19 VITALS — BP 116/65 | HR 79 | Temp 98.6°F | Resp 14 | Ht 65.0 in | Wt 108.0 lb

## 2013-07-19 DIAGNOSIS — D4620 Refractory anemia with excess of blasts, unspecified: Secondary | ICD-10-CM

## 2013-07-19 DIAGNOSIS — D46Z Other myelodysplastic syndromes: Secondary | ICD-10-CM

## 2013-07-19 DIAGNOSIS — D509 Iron deficiency anemia, unspecified: Secondary | ICD-10-CM

## 2013-07-19 DIAGNOSIS — D510 Vitamin B12 deficiency anemia due to intrinsic factor deficiency: Secondary | ICD-10-CM

## 2013-07-19 LAB — CBC WITH DIFFERENTIAL (CANCER CENTER ONLY)
Eosinophils Absolute: 0 10*3/uL (ref 0.0–0.5)
LYMPH#: 0.7 10*3/uL — ABNORMAL LOW (ref 0.9–3.3)
MONO#: 0.4 10*3/uL (ref 0.1–0.9)
MONO%: 5.9 % (ref 0.0–13.0)
NEUT#: 5.5 10*3/uL (ref 1.5–6.5)
Platelets: 210 10*3/uL (ref 145–400)
RBC: 2.69 10*6/uL — ABNORMAL LOW (ref 3.70–5.32)
WBC: 6.6 10*3/uL (ref 3.9–10.0)

## 2013-07-19 LAB — HOLD TUBE, BLOOD BANK - CHCC SATELLITE

## 2013-07-19 LAB — RETICULOCYTES (CHCC)
ABS Retic: 113.2 10*3/uL (ref 19.0–186.0)
RBC.: 3.06 MIL/uL — ABNORMAL LOW (ref 3.87–5.11)

## 2013-07-19 NOTE — Progress Notes (Signed)
This office note has been dictated.

## 2013-07-20 NOTE — Progress Notes (Signed)
DIAGNOSIS:  Autoimmune hemolytic anemia.  CURRENT THERAPY:  Patient to have prednisone dose decreased down to 40 mg p.o. q. day.  INTERIM HISTORY:  Nicole Harvey comes in for followup.  She continues to do incredibly well.  Her color looks better.  We have not had to transfuse her now for a month.  Her hemolytic anemia is improving.  Her reticulocyte count continues to come down.  When we last saw her, her retic count was down to 4%.  She still has the dementia, but it is very pleasant.  She is doing well with the prednisone.  So far, there have been no complications from this.  PHYSICAL EXAMINATION:  General:  This is an elderly, but fairly well- nourished white female in no obvious distress.  Vital signs:  Show temperature of 98.6, pulse 79, respiratory rate 14, blood pressure 116/65.  Weight is 108 pounds.  Head and Neck Exam:  Shows a normocephalic, atraumatic skull.  There are no ocular or oral lesions. There are no palpable cervical or supraclavicular lymph nodes.  Lungs: Clear bilaterally.  Cardiac Exam:  Regular rate and rhythm with a normal S1, S2.  It shows a 2/6 systolic ejection murmur.  Abdomen:  Soft.  She has good bowel sounds.  There is no fluid wave.  There is no palpable hepatosplenomegaly.  Extremities:  Show no clubbing, cyanosis or edema.  LABORATORY STUDIES:  White cell count is 6.6, hemoglobin 10.2, hematocrit 29.4, platelet count 210,000.  MCV is 109.  IMPRESSION:  Nicole Harvey is a very charming 79 year old white female with autoimmune hemolytic anemia.  We finally were able to get to the bottom of her anemia issues.  She has responded well to prednisone.  Again, we will cut her prednisone dose down to 40 mg a day now.  She will continue to have her blood work checked every 3 weeks.  I will plan see her back in 6 weeks.    ______________________________ Josph Macho, M.D. PRE/MEDQ  D:  07/19/2013  T:  07/20/2013  Job:  [id number]

## 2013-08-09 ENCOUNTER — Other Ambulatory Visit (HOSPITAL_BASED_OUTPATIENT_CLINIC_OR_DEPARTMENT_OTHER): Payer: Medicare Other | Admitting: Lab

## 2013-08-09 ENCOUNTER — Other Ambulatory Visit: Payer: Medicare Other | Admitting: Lab

## 2013-08-09 DIAGNOSIS — D649 Anemia, unspecified: Secondary | ICD-10-CM

## 2013-08-09 DIAGNOSIS — D4620 Refractory anemia with excess of blasts, unspecified: Secondary | ICD-10-CM

## 2013-08-09 LAB — CBC WITH DIFFERENTIAL (CANCER CENTER ONLY)
Eosinophils Absolute: 0 10*3/uL (ref 0.0–0.5)
HCT: 29.2 % — ABNORMAL LOW (ref 34.8–46.6)
HGB: 10.1 g/dL — ABNORMAL LOW (ref 11.6–15.9)
LYMPH%: 11.1 % — ABNORMAL LOW (ref 14.0–48.0)
MCV: 101 fL (ref 81–101)
MONO#: 0.4 10*3/uL (ref 0.1–0.9)
Platelets: 219 10*3/uL (ref 145–400)
RBC: 2.89 10*6/uL — ABNORMAL LOW (ref 3.70–5.32)
WBC: 5.7 10*3/uL (ref 3.9–10.0)

## 2013-08-09 LAB — HOLD TUBE, BLOOD BANK - CHCC SATELLITE

## 2013-08-31 ENCOUNTER — Ambulatory Visit (HOSPITAL_BASED_OUTPATIENT_CLINIC_OR_DEPARTMENT_OTHER): Payer: Medicare Other | Admitting: Hematology & Oncology

## 2013-08-31 ENCOUNTER — Other Ambulatory Visit (HOSPITAL_BASED_OUTPATIENT_CLINIC_OR_DEPARTMENT_OTHER): Payer: Medicare Other | Admitting: Lab

## 2013-08-31 ENCOUNTER — Encounter: Payer: Self-pay | Admitting: Hematology & Oncology

## 2013-08-31 VITALS — BP 115/60 | HR 81 | Temp 97.6°F | Resp 14 | Ht 65.0 in | Wt 108.0 lb

## 2013-08-31 DIAGNOSIS — F0390 Unspecified dementia without behavioral disturbance: Secondary | ICD-10-CM

## 2013-08-31 DIAGNOSIS — D4620 Refractory anemia with excess of blasts, unspecified: Secondary | ICD-10-CM

## 2013-08-31 DIAGNOSIS — D591 Autoimmune hemolytic anemia, unspecified: Secondary | ICD-10-CM

## 2013-08-31 HISTORY — DX: Autoimmune hemolytic anemia, unspecified: D59.10

## 2013-08-31 LAB — RETICULOCYTES (CHCC): RBC.: 3.19 MIL/uL — ABNORMAL LOW (ref 3.87–5.11)

## 2013-08-31 LAB — CBC WITH DIFFERENTIAL (CANCER CENTER ONLY)
BASO#: 0 10*3/uL (ref 0.0–0.2)
EOS%: 0.8 % (ref 0.0–7.0)
HCT: 29.7 % — ABNORMAL LOW (ref 34.8–46.6)
HGB: 10.1 g/dL — ABNORMAL LOW (ref 11.6–15.9)
LYMPH#: 0.4 10*3/uL — ABNORMAL LOW (ref 0.9–3.3)
MCHC: 34 g/dL (ref 32.0–36.0)
MONO#: 0.3 10*3/uL (ref 0.1–0.9)
NEUT%: 84.6 % — ABNORMAL HIGH (ref 39.6–80.0)

## 2013-08-31 NOTE — Progress Notes (Signed)
This office note has been dictated.

## 2013-09-01 NOTE — Progress Notes (Signed)
CC:   Nicole Harvey, M.D.  DIAGNOSIS:  Autoimmune hemolytic anemia.  CURRENT THERAPY: 1. Prednisone dose 40 mg p.o. q. day alternating with 20 mg p.o. q.     day. 2. Folic acid 1 mg p.o. q. day.  INTERIM HISTORY:  Nicole Harvey comes in for a followup.  She is doing fairly well.  She still has the bad dementia.  Her son thinks that the prednisone may be making that a little bit worse.  It is hard to say, but I certainly cannot argue with that.  She has had no problems with increasing fatigue or weakness.  Her appetite has been doing okay.  She has had no nausea or vomiting.  There has been no headache.  She has had no fever, sweats, or chills.  PHYSICAL EXAMINATION:  General:  This is a petite, elderly white female, in no obvious distress.  Vital Signs:  Show a temperature of 97.6, pulse 81, respiratory rate 14, blood pressure 115/60.  Weight is 108 pounds. Head and Neck:  Shows a normocephalic, atraumatic skull.  There are no ocular or oral lesions.  There are no palpable cervical or supraclavicular lymph nodes.  Lungs:  Clear bilaterally.  Cardiac: Regular rate and rhythm with a normal S1, S2.  There are no murmurs, rubs or bruits.  Abdomen:  Soft.  She has good bowel sounds.  There is no fluid wave.  There is no palpable hepatosplenomegaly.  Extremities: Show some age-related osteoarthritic changes.  She has decent range of motion of her joints.  She has good muscle strength bilaterally. Neurological:  Shows no focal neurological deficits.  LABORATORY STUDIES:  White cell count is 5, hemoglobin 10.1, hematocrit 29.7, platelet count is 254.  MCV is 101.  IMPRESSION:  Nicole Harvey is a very charming 79 year old white female. She has autoimmune hemolytic anemia.  She is doing fairly well.  Her blood count is being maintained.  We will go ahead and adjust the dose of prednisone from 40 mg a day down to 40 mg a day alternating with 20 mg a day.  We will continue her on the  folic acid.  I think we can probably get her back in 6 weeks.  I do not think we need any blood work in between visits.   ______________________________ Josph Macho, M.D. PRE/MEDQ  D:  08/31/2013  T:  09/01/2013  Job:  [id number]

## 2013-10-10 ENCOUNTER — Ambulatory Visit (HOSPITAL_BASED_OUTPATIENT_CLINIC_OR_DEPARTMENT_OTHER): Payer: Medicare Other | Admitting: Hematology & Oncology

## 2013-10-10 ENCOUNTER — Other Ambulatory Visit (HOSPITAL_BASED_OUTPATIENT_CLINIC_OR_DEPARTMENT_OTHER): Payer: Medicare Other | Admitting: Lab

## 2013-10-10 VITALS — BP 111/54 | HR 54 | Temp 97.8°F | Resp 14 | Ht 65.0 in | Wt 110.0 lb

## 2013-10-10 DIAGNOSIS — F0390 Unspecified dementia without behavioral disturbance: Secondary | ICD-10-CM

## 2013-10-10 DIAGNOSIS — D4620 Refractory anemia with excess of blasts, unspecified: Secondary | ICD-10-CM

## 2013-10-10 LAB — CBC WITH DIFFERENTIAL (CANCER CENTER ONLY)
BASO#: 0.1 10*3/uL (ref 0.0–0.2)
EOS%: 0.3 % (ref 0.0–7.0)
Eosinophils Absolute: 0 10*3/uL (ref 0.0–0.5)
HCT: 31.5 % — ABNORMAL LOW (ref 34.8–46.6)
HGB: 10.4 g/dL — ABNORMAL LOW (ref 11.6–15.9)
LYMPH%: 4.5 % — ABNORMAL LOW (ref 14.0–48.0)
MCH: 33.8 pg (ref 26.0–34.0)
MCHC: 33 g/dL (ref 32.0–36.0)
MCV: 102 fL — ABNORMAL HIGH (ref 81–101)
MONO#: 0.3 10*3/uL (ref 0.1–0.9)
MONO%: 4.5 % (ref 0.0–13.0)
NEUT#: 5.2 10*3/uL (ref 1.5–6.5)
NEUT%: 89.7 % — ABNORMAL HIGH (ref 39.6–80.0)
RBC: 3.08 10*6/uL — ABNORMAL LOW (ref 3.70–5.32)

## 2013-10-10 LAB — RETICULOCYTES (CHCC)
ABS Retic: 101.4 10*3/uL (ref 19.0–186.0)
Retic Ct Pct: 3 % — ABNORMAL HIGH (ref 0.4–2.3)

## 2013-10-10 LAB — CHCC SATELLITE - SMEAR

## 2013-10-10 NOTE — Progress Notes (Signed)
This office note has been dictated.

## 2013-10-21 NOTE — Progress Notes (Signed)
CC:   Nicole Harvey, M.D.  DIAGNOSIS:  Autoimmune hemolytic anemia.  CURRENT THERAPY: 1. Prednisone 20 mg p.o. daily. 2. Folic acid 1 mg p.o. daily.  INTERIM HISTORY:  Nicole Harvey comes in for followup.  We saw her back a month or so ago.  She continues to do quite well.  The autoimmune hemolytic anemia continues to resolve.  Her reticulocyte count back in October was down to 2.5%.  She is doing well with the prednisone.  She has had no problems with the prednisone.  There has been no dyspepsia.  She has had no mouth sores. She has had no thrush.  Her dementia continues to be an issue.  Thankfully, it does not seem to be progressing any right now.  PHYSICAL EXAMINATION:  General:  This is a petite, elderly white female in no obvious distress.  Vital Signs:  Temperature 97.8, pulse 54, respiratory rate 14, blood pressure 111/54.  Weight is 110 pounds.  Head and Neck:  Normocephalic, atraumatic skull.  There are no ocular or oral lesions.  She has no scleral icterus.  There are no cushingoid features. She has no adenopathy in her neck.  Lungs:  Clear bilaterally.  She has no rales, wheezes, or rhonchi.  Cardiac:  Regular rate and rhythm with a normal S1, S2.  There are no murmurs, rubs or bruits.  Abdomen:  Soft. She has good bowel sounds.  There is no fluid wave.  There is no palpable abdominal mass.  There is no palpable hepatosplenomegaly. Extremities:  Show some trace edema in her lower legs.  She has osteoarthritic changes in her joints.  She has 4+/5 strength in her muscles.  Skin:  Shows some scattered ecchymoses.  Neurological:  Shows no focal neurological deficits.  LABORATORY STUDIES:  White cell count is 5.3, hemoglobin 10.4, hematocrit 31.5, platelet count is 245.  MCV is 102.  IMPRESSION:  Nicole Harvey is a very charming 79 year old white female with autoimmune hemolytic anemia.  Because of the dementia, we did not go on a thorough "hunt" for an etiology.  I have  not seen anything on her physical exam that would suggest an underlying lymphoproliferative process.  She is responding to the prednisone.  We will cut her dose back to 20 mg a day now.  We will continue to follow her every 4 to 6 weeks.  I am just glad that her blood count is maintaining itself and that her quality of life is doing well.  ADDENDUM:  I did speak to her son.  Because of Nicole Harvey's dementia, her son really is the one in charge.  I showed him the lab work.  I told him that we were cutting her prednisone dose down to 20 mg a day.  He understands and he is thankful that she is doing well.    ______________________________ Josph Macho, M.D. PRE/MEDQ  D:  10/10/2013  T:  10/20/2013  Job:  [id number]

## 2013-10-24 ENCOUNTER — Observation Stay (HOSPITAL_COMMUNITY)
Admission: EM | Admit: 2013-10-24 | Discharge: 2013-10-25 | Disposition: A | Discharge status: Home / Self Care | Payer: Medicare Other | Attending: Internal Medicine | Admitting: Internal Medicine

## 2013-10-24 ENCOUNTER — Encounter (HOSPITAL_COMMUNITY): Payer: Self-pay | Admitting: Emergency Medicine

## 2013-10-24 ENCOUNTER — Other Ambulatory Visit: Payer: Self-pay

## 2013-10-24 ENCOUNTER — Emergency Department (HOSPITAL_COMMUNITY): Payer: Medicare Other

## 2013-10-24 DIAGNOSIS — D649 Anemia, unspecified: Secondary | ICD-10-CM | POA: Insufficient documentation

## 2013-10-24 DIAGNOSIS — IMO0002 Reserved for concepts with insufficient information to code with codable children: Secondary | ICD-10-CM | POA: Insufficient documentation

## 2013-10-24 DIAGNOSIS — E785 Hyperlipidemia, unspecified: Secondary | ICD-10-CM | POA: Insufficient documentation

## 2013-10-24 DIAGNOSIS — R079 Chest pain, unspecified: Secondary | ICD-10-CM | POA: Diagnosis present

## 2013-10-24 DIAGNOSIS — Z79899 Other long term (current) drug therapy: Secondary | ICD-10-CM | POA: Insufficient documentation

## 2013-10-24 DIAGNOSIS — D4620 Refractory anemia with excess of blasts, unspecified: Secondary | ICD-10-CM

## 2013-10-24 DIAGNOSIS — R0789 Other chest pain: Principal | ICD-10-CM | POA: Insufficient documentation

## 2013-10-24 DIAGNOSIS — D591 Autoimmune hemolytic anemia, unspecified: Secondary | ICD-10-CM | POA: Insufficient documentation

## 2013-10-24 HISTORY — DX: Pure hypercholesterolemia, unspecified: E78.00

## 2013-10-24 HISTORY — DX: Pneumonia, unspecified organism: J18.9

## 2013-10-24 HISTORY — DX: Personal history of other medical treatment: Z92.89

## 2013-10-24 HISTORY — DX: Unspecified dementia, unspecified severity, without behavioral disturbance, psychotic disturbance, mood disturbance, and anxiety: F03.90

## 2013-10-24 HISTORY — DX: Calculus of kidney: N20.0

## 2013-10-24 HISTORY — DX: Cardiac murmur, unspecified: R01.1

## 2013-10-24 HISTORY — DX: Unspecified osteoarthritis, unspecified site: M19.90

## 2013-10-24 HISTORY — DX: Unspecified malignant neoplasm of skin of unspecified part of face: C44.300

## 2013-10-24 HISTORY — DX: Family history of other specified conditions: Z84.89

## 2013-10-24 LAB — BASIC METABOLIC PANEL
BUN: 19 mg/dL (ref 6–23)
Calcium: 9.1 mg/dL (ref 8.4–10.5)
GFR calc Af Amer: >90 mL/min (ref >90)
GFR calc non Af Amer: 88 mL/min — ABNORMAL LOW (ref >90)
Glucose, Bld: 109 mg/dL — ABNORMAL HIGH (ref 70–99)
Potassium: 3.6 mEq/L (ref 3.5–5.1)
Sodium: 141 mEq/L (ref 135–145)

## 2013-10-24 LAB — CBC
MCH: 32.3 pg (ref 26.0–34.0)
MCHC: 33.7 g/dL (ref 30.0–36.0)
Platelets: 235 10*3/uL (ref 150–400)
RDW: 14.7 % (ref 11.5–15.5)

## 2013-10-24 LAB — POCT I-STAT TROPONIN I: Troponin i, poc: 0 ng/mL (ref 0.00–0.08)

## 2013-10-24 MED ORDER — ASPIRIN 81 MG PO CHEW
324.0000 mg | CHEWABLE_TABLET | Freq: Once | ORAL | Status: AC
  Administered 2013-10-24: 324 mg via ORAL
  Filled 2013-10-24: qty 4

## 2013-10-24 NOTE — ED Notes (Signed)
Pt c/o chest pain that started during an argument with her husband and described as pressure in the center of her chest 1/10

## 2013-10-24 NOTE — ED Notes (Signed)
Pt brought in by [facility], Per EMS pt was having an argument with her husband and developed chest pain, Pt describes as "pressure" in her chest

## 2013-10-24 NOTE — ED Provider Notes (Signed)
CSN: [id number]     Arrival date & time 10/24/13  2034 History   First MD Initiated Contact with Patient 10/24/13 2054     Chief Complaint  Patient presents with  . Chest Pain    pressure   (Consider location/radiation/quality/duration/timing/severity/associated sxs/prior Treatment) HPI Comments: 79 year old female presents with one hour of chest pressure/heaviness that started after her and her husband on an argument. She states she is very upset and started having this chest pressure. It slowly resolved by the time EMS picked her up. They did not give her any specific interventions. She's never had pain like this before. She's never been a smoker, and does not have hypertension. She has had a history of hyperlipidemia. Never had a cath or stress test. Patient denies any shortness of breath, nausea, vomiting, or diaphoresis. She is completely asymptomatic right now.   Past Medical History  Diagnosis Date  . Myelodysplastic syndrome, low grade 10/09/2011  . Anemia   . PONV (postoperative nausea and vomiting)   . AIHA (autoimmune hemolytic anemia) 08/31/2013   Past Surgical History  Procedure Laterality Date  . Abdominal hysterectomy    . Kidney stone removal     History reviewed. No pertinent family history. History  Substance Use Topics  . Smoking status: Never Smoker   . Smokeless tobacco: Never Used  . Alcohol Use: No   OB History   Grav Para Term Preterm Abortions TAB SAB Ect Mult Living                 Review of Systems  Constitutional: Negative for fever, chills and diaphoresis.  Respiratory: Negative for cough and shortness of breath.   Cardiovascular: Positive for chest pain. Negative for leg swelling.  Gastrointestinal: Negative for nausea and vomiting.  Musculoskeletal: Negative for back pain.  All other systems reviewed and are negative.    Allergies  Other and Codeine  Home Medications   Current Outpatient Rx  Name  Route  Sig  Dispense  Refill  .  donepezil (ARICEPT) 10 MG tablet   Oral   Take 10 mg by mouth at bedtime.         . folic acid (FOLVITE) 1 MG tablet   Oral   Take 2 tablets (2 mg total) by mouth daily.   60 tablet   4   . pantoprazole (PROTONIX) 40 MG tablet   Oral   Take 1 tablet (40 mg total) by mouth daily.   30 tablet   3   . predniSONE (DELTASONE) 20 MG tablet   Oral   Take 60 mg by mouth daily with breakfast. Take 2 pills a day with food.          BP 115/64  Pulse 63  Temp(Src) 98.2 F (36.8 C) (Oral)  Resp 11  Wt 110 lb (49.896 kg)  SpO2 98% Physical Exam  Nursing note and vitals reviewed. Constitutional: She is oriented to person, place, and time. She appears well-developed and well-nourished.  HENT:  Head: Normocephalic and atraumatic.  Right Ear: External ear normal.  Left Ear: External ear normal.  Nose: Nose normal.  Eyes: Right eye exhibits no discharge. Left eye exhibits no discharge.  Cardiovascular: Normal rate, regular rhythm and normal heart sounds.   Pulmonary/Chest: Effort normal and breath sounds normal. She exhibits no tenderness.  Abdominal: Soft. There is no tenderness.  Musculoskeletal: She exhibits no tenderness.  Neurological: She is alert and oriented to person, place, and time.  Skin: Skin is warm and  dry.    ED Course  Procedures (including critical care time) Labs Review Labs Reviewed  CBC - Abnormal; Notable for the following:    RBC 3.44 (*)    Hemoglobin 11.1 (*)    HCT 32.9 (*)    All other components within normal limits  BASIC METABOLIC PANEL - Abnormal; Notable for the following:    Glucose, Bld 109 (*)    Creatinine, Ser 0.48 (*)    GFR calc non Af Amer 88 (*)    All other components within normal limits  PRO B NATRIURETIC PEPTIDE  POCT I-STAT TROPONIN I   Imaging Review Dg Chest 2 View  10/24/2013   CLINICAL DATA:  79 year old female chest pain and pressure. Initial encounter.  EXAM: CHEST  2 VIEW  COMPARISON:  None.  FINDINGS: Mild  elevation of the right hemidiaphragm. Blunting of that costophrenic angle suggesting a small right pleural effusion. No pneumothorax or pulmonary edema. No confluent pulmonary opacity. Normal cardiac size and mediastinal contours. Visualized tracheal air column is within normal limits. Osteopenia. No definite acute osseous abnormality.  IMPRESSION: Small right pleural effusion. No other acute cardiopulmonary abnormality identified.   Electronically Signed   By: Augusto Gamble M.D.   On: 10/24/2013 21:49    EKG Interpretation    Date/Time:  Tuesday October 24 2013 20:45:25 EST Ventricular Rate:  75 PR Interval:  179 QRS Duration: 80 QT Interval:  391 QTC Calculation: 437 R Axis:   60 Text Interpretation:  Sinus rhythm no acute ischemia Confirmed by Durene Dodge  MD, Jimena Wieczorek ([id number]) on 10/24/2013 10:40:50 PM            MDM   1. Chest pain    Given the patient's symptoms occurred with stress there is some concern for inducible ischemia. Her EKG and labs are benign here. Given aspirin. Her HEART score is 4, which puts her at moderate risk. After discussing with the patient and her family they agreed to do observation for serial troponins and risk stratification.    Audree Camel, MD 10/24/13 2242

## 2013-10-25 ENCOUNTER — Encounter (HOSPITAL_COMMUNITY): Payer: Self-pay | Admitting: General Practice

## 2013-10-25 DIAGNOSIS — R079 Chest pain, unspecified: Secondary | ICD-10-CM

## 2013-10-25 DIAGNOSIS — D4620 Refractory anemia with excess of blasts, unspecified: Secondary | ICD-10-CM

## 2013-10-25 LAB — TROPONIN I: Troponin I: <0.3 ng/mL (ref <0.30)

## 2013-10-25 MED ORDER — PREDNISONE 20 MG PO TABS
20.0000 mg | ORAL_TABLET | Freq: Every day | ORAL | Status: DC
  Administered 2013-10-25: 20 mg via ORAL
  Filled 2013-10-25 (×2): qty 1

## 2013-10-25 MED ORDER — ACETAMINOPHEN 325 MG PO TABS: 650.0000 mg | ORAL_TABLET | ORAL | Status: DC | PRN

## 2013-10-25 MED ORDER — ONDANSETRON HCL 4 MG/2ML IJ SOLN: 4.0000 mg | Freq: Four times a day (QID) | INTRAMUSCULAR | Status: DC | PRN

## 2013-10-25 MED ORDER — PANTOPRAZOLE SODIUM 40 MG PO TBEC
40.0000 mg | DELAYED_RELEASE_TABLET | Freq: Every day | ORAL | Status: DC
  Administered 2013-10-25: 40 mg via ORAL
  Filled 2013-10-25: qty 1

## 2013-10-25 MED ORDER — HEPARIN SODIUM (PORCINE) 5000 UNIT/ML IJ SOLN
5000.0000 [IU] | Freq: Three times a day (TID) | INTRAMUSCULAR | Status: DC
  Administered 2013-10-25: 5000 [IU] via SUBCUTANEOUS
  Filled 2013-10-25 (×4): qty 1

## 2013-10-25 MED ORDER — PREDNISONE 50 MG PO TABS: 60.0000 mg | ORAL_TABLET | Freq: Every day | ORAL | Status: DC

## 2013-10-25 MED ORDER — FOLIC ACID 1 MG PO TABS
2.0000 mg | ORAL_TABLET | Freq: Every day | ORAL | Status: DC
  Administered 2013-10-25: 2 mg via ORAL
  Filled 2013-10-25: qty 2

## 2013-10-25 MED ORDER — DONEPEZIL HCL 10 MG PO TABS
10.0000 mg | ORAL_TABLET | Freq: Every day | ORAL | Status: DC
  Filled 2013-10-25 (×2): qty 1

## 2013-10-25 MED ORDER — ASPIRIN EC 81 MG PO TBEC: 81.0000 mg | DELAYED_RELEASE_TABLET | Freq: Every day | ORAL | Status: DC

## 2013-10-25 NOTE — Discharge Summary (Signed)
Physician Discharge Summary  Nicole Harvey ZOX:[id number] DOB: [birth year] DOA: 10/24/2013  PCP: Kaleen Mask, MD  Admit date: 10/24/2013 Discharge date: 10/25/2013  Time spent: 35 minutes  Recommendations for Outpatient Follow-up:  1. Follow up with cardiology for further evaluation of aortic stenosis and chest pain.   Discharge Diagnoses:    Chest pain   Aortic stenosis   Myelodysplastic syndrome, low grade    Dementia    autoimmune hemolytic anemia    Discharge Condition: Stable  Diet recommendation: Heart Healthy  Filed Weights   10/24/13 2050 10/25/13 0030 10/25/13 0350  Weight: 49.896 kg (110 lb) 47.718 kg (105 lb 3.2 oz) 47.418 kg (104 lb 8.6 oz)    History of present illness:  Nicole Harvey is a 78 y.o. female who presents to the ED with c/o chest pain. Pain is a pressure like sensation that lasted for 1 hour before spontaneously resolving without intervention. It occurred in the context of having an argument with her husband at home earlier this evening which made her upset. She has never had pain like this before, never had a heart cath nor stress test. Does have history of hyperlipidemia. Pain located in center of chest. Patient asymptomatic at this time, her pain resolved completely before arrival to the ED after being picked up by EMS.   Hospital Course:  1-Chest pain: Patient was admitted to telemetry. She presents with chest pain. it occurred in the context of having an argument with her husband.  She is chest pain free. EKG with no significant ST elevation. Troponin times 3 negative. She denies chest pain on exertion. She also denies dyspnea on exertion. No evidence of acute heart failure exacerbation. Patient will be discharge today with follow up with cardiology for further evaluation of aortic stenosis and chest pain.   Procedures:  none  Consultations:  None  Discharge Exam: Filed Vitals:   10/25/13 0350  BP: 104/59  Pulse: 88   Temp: 97.7 F (36.5 C)  Resp: 20    General: No distress.  Cardiovascular: S 1, S 2 RRR Respiratory: CTA  Discharge Instructions  Discharge Orders   Future Appointments Provider Department Dept Phone   11/21/2013 10:00 AM Rachael Fee [facility] [place] [phone]   11/21/2013 10:30 AM Josph Macho, MD [facility] [place] [phone]   Future Orders Complete By Expires   Diet - low sodium heart healthy  As directed    Increase activity slowly  As directed        Medication List         donepezil 10 MG tablet  Commonly known as:  ARICEPT  Take 10 mg by mouth at bedtime.     folic acid 1 MG tablet  Commonly known as:  FOLVITE  Take 2 tablets (2 mg total) by mouth daily.     pantoprazole 40 MG tablet  Commonly known as:  PROTONIX  Take 1 tablet (40 mg total) by mouth daily.     predniSONE 20 MG tablet  Commonly known as:  DELTASONE  Take 60 mg by mouth daily with breakfast. Take 2 pills a day with food.       Allergies  Allergen Reactions  . Other Shortness Of Breath and Itching    Med for ???? Instructed patient to bring.  . Codeine Nausea And Vomiting      The results of significant diagnostics from this hospitalization (including imaging, microbiology, ancillary and laboratory)  are listed below for reference.    Significant Diagnostic Studies: Dg Chest 2 View  10/24/2013   CLINICAL DATA:  79 year old female chest pain and pressure. Initial encounter.  EXAM: CHEST  2 VIEW  COMPARISON:  None.  FINDINGS: Mild elevation of the right hemidiaphragm. Blunting of that costophrenic angle suggesting a small right pleural effusion. No pneumothorax or pulmonary edema. No confluent pulmonary opacity. Normal cardiac size and mediastinal contours. Visualized tracheal air column is within normal limits. Osteopenia. No definite acute osseous abnormality.  IMPRESSION: Small right pleural effusion. No other acute cardiopulmonary  abnormality identified.   Electronically Signed   By: Augusto Gamble M.D.   On: 10/24/2013 21:49    Microbiology: No results found for this or any previous visit (from the past 240 hour(s)).   Labs: Basic Metabolic Panel:  Recent Labs Lab 10/24/13 2055  NA 141  K 3.6  CL 106  CO2 28  GLUCOSE 109*  BUN 19  CREATININE 0.48*  CALCIUM 9.1   Liver Function Tests: No results found for this basename: AST, ALT, ALKPHOS, BILITOT, PROT, ALBUMIN,  in the last 168 hours No results found for this basename: LIPASE, AMYLASE,  in the last 168 hours No results found for this basename: AMMONIA,  in the last 168 hours CBC:  Recent Labs Lab 10/24/13 2107  WBC 6.5  HGB 11.1*  HCT 32.9*  MCV 95.6  PLT 235   Cardiac Enzymes:  Recent Labs Lab 10/25/13 0300 10/25/13 0835  TROPONINI <0.30 <0.30   BNP: BNP (last 3 results)  Recent Labs  10/24/13 2055  PROBNP 206.4   CBG: No results found for this basename: GLUCAP,  in the last 168 hours     Signed:  Spenser Cong  [facility] 10/25/2013, 11:01 AM

## 2013-10-25 NOTE — Progress Notes (Signed)
D/c orders received; pt remains in stable condition, pt meds and instructions reviewed and given to pt and pt family, pt d/c to home

## 2013-10-25 NOTE — H&P (Signed)
[facility] History and Physical  Nicole Harvey FAO:[id number] DOB: Aug 19, 1928 DOA: 10/24/2013  Referring physician: ED PCP: Kaleen Mask, MD  Chief Complaint: Chest pain  HPI: Nicole Harvey is a 79 y.o. female who presents to the ED with c/o chest pain.  Pain is a pressure like sensation that lasted for 1 hour before spontaneously resolving without intervention.  It occurred in the context of having an argument with her husband at home earlier this evening which made her upset.  She has never had pain like this before, never had a heart cath nor stress test.  Does have history of hyperlipidemia.  Pain located in center of chest.  Patient asymptomatic at this time, her pain resolved completely before arrival to the ED after being picked up by EMS.  Review of Systems: 12 systems reviewed and otherwise negative.  Past Medical History  Diagnosis Date  . Anemia   . PONV (postoperative nausea and vomiting)   . 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 Family history of anesthesia complication     "son did; don't remember what reaction was though" (10/25/2013)  . High cholesterol   . Heart murmur   . Pneumonia     "when I was a little girl" (10/25/2013)  . History of blood transfusion     "quite a few; related to Myelodysplastic" (10/25/2013)  . Dementia     "don't know kind or stage" (10/25/2013)  . Arthritis     "hands" (10/25/2013)  . Kidney stones   . Myelodysplastic syndrome, low grade 10/09/2011  . Skin cancer of face     "several cut off face" (10/25/2013)   Past Surgical History  Procedure Laterality Date  . Kidney stone surgery  ?1970's  . Tonsillectomy    . Vaginal hysterectomy     Social History:  reports that she has never smoked. She has never used smokeless tobacco. She reports that she does not drink alcohol or use illicit drugs.   Allergies  Allergen Reactions  . Other Shortness Of Breath and Itching    Med for ???? Instructed  patient to bring.  . Codeine Nausea And Vomiting    History reviewed. No pertinent family history.  Prior to Admission medications   Medication Sig Start Date End Date Taking? Authorizing Provider  donepezil (ARICEPT) 10 MG tablet Take 10 mg by mouth at bedtime.   Yes Historical Provider, MD  folic acid (FOLVITE) 1 MG tablet Take 2 tablets (2 mg total) by mouth daily. 06/28/13  Yes Josph Macho, MD  pantoprazole (PROTONIX) 40 MG tablet Take 1 tablet (40 mg total) by mouth daily. 06/28/13  Yes Josph Macho, MD  predniSONE (DELTASONE) 20 MG tablet Take 60 mg by mouth daily with breakfast. Take 2 pills a day with food. 06/28/13  Yes Josph Macho, MD   Physical Exam: Filed Vitals:   10/25/13 0030  BP: 125/75  Pulse: 83  Temp: 97.6 F (36.4 C)  Resp:     General:  NAD, resting comfortably in bed Eyes: PEERLA EOMI ENT: mucous membranes moist Neck: supple w/o JVD Cardiovascular: RRR does have a 3/6 SEM with ? Radiation to carotids Respiratory: CTA B Abdomen: soft, nt, nd, bs+ Skin: no rash nor lesion Musculoskeletal: MAE, full ROM all 4 extremities Psychiatric: normal tone and affect Neurologic: AAOx3, grossly non-focal  Labs on Admission:  Basic Metabolic Panel:  Recent Labs Lab 10/24/13 2055  NA 141  K 3.6  CL 106  CO2 28  GLUCOSE 109*  BUN 19  CREATININE 0.48*  CALCIUM 9.1   Liver Function Tests: No results found for this basename: AST, ALT, ALKPHOS, BILITOT, PROT, ALBUMIN,  in the last 168 hours No results found for this basename: LIPASE, AMYLASE,  in the last 168 hours No results found for this basename: AMMONIA,  in the last 168 hours CBC:  Recent Labs Lab 10/24/13 2107  WBC 6.5  HGB 11.1*  HCT 32.9*  MCV 95.6  PLT 235   Cardiac Enzymes: No results found for this basename: CKTOTAL, CKMB, CKMBINDEX, TROPONINI,  in the last 168 hours  BNP (last 3 results)  Recent Labs  10/24/13 2055  PROBNP 206.4   CBG: No results found for this basename:  GLUCAP,  in the last 168 hours  Radiological Exams on Admission: Dg Chest 2 View  10/24/2013   CLINICAL DATA:  79 year old female chest pain and pressure. Initial encounter.  EXAM: CHEST  2 VIEW  COMPARISON:  None.  FINDINGS: Mild elevation of the right hemidiaphragm. Blunting of that costophrenic angle suggesting a small right pleural effusion. No pneumothorax or pulmonary edema. No confluent pulmonary opacity. Normal cardiac size and mediastinal contours. Visualized tracheal air column is within normal limits. Osteopenia. No definite acute osseous abnormality.  IMPRESSION: Small right pleural effusion. No other acute cardiopulmonary abnormality identified.   Electronically Signed   By: Augusto Gamble M.D.   On: 10/24/2013 21:49    EKG: Independently reviewed.  Assessment/Plan Active Problems:   Chest pain   1. Chest pain - while CAD is a possibility and we will certainly trend troponins.  I am actually more concerned that this may have been due to stress combined with severe aortic stenosis which was demonstrated on 2d Echo in 04/29/13.  However, given the patients dementia, the single 1 hour episode of chest pressure, I am not convinced that AVR (a very invasive procedure) is indicated at this point.  Given that severe AVR has already been confirmed on 2D echo and patient asymptomatic at this time, I have not ordered another 2D echo.  Day team may wish to consult cardiology, but I am very doubtful that they will want to rush into an aortic valve procedure in this otherwise asymptomatic 79 year old patient with dementia.    Code Status: Full (must indicate code status--if unknown or must be presumed, indicate so) Family Communication: Son and husband at bedside (indicate person spoken with, if applicable, with phone number if by telephone) Disposition Plan: Admit to inpatient (indicate anticipated LOS)  Time spent: 70 min  Linkon Siverson M. [facility] Pager [phone]  If 7PM-7AM,  please contact night-coverage [email or web address] Password [email or web address] 10/25/2013, 1:28 AM

## 2013-10-25 NOTE — Progress Notes (Signed)
UR Completed Shelvie Salsberry Graves-Bigelow, RN,BSN [phone]  

## 2013-10-26 ENCOUNTER — Telehealth: Payer: Self-pay | Admitting: Hematology & Oncology

## 2013-10-26 NOTE — Telephone Encounter (Signed)
Left message moved 1-6 to 1-13

## 2013-10-31 ENCOUNTER — Ambulatory Visit: Payer: Medicare Other | Admitting: Cardiology

## 2013-11-14 ENCOUNTER — Encounter: Payer: Self-pay | Admitting: Cardiology

## 2013-11-21 ENCOUNTER — Ambulatory Visit: Payer: Medicare Other | Admitting: Hematology & Oncology

## 2013-11-21 ENCOUNTER — Other Ambulatory Visit: Payer: Medicare Other | Admitting: Lab

## 2013-11-22 ENCOUNTER — Encounter: Payer: Self-pay | Admitting: Cardiology

## 2013-11-28 ENCOUNTER — Other Ambulatory Visit (HOSPITAL_BASED_OUTPATIENT_CLINIC_OR_DEPARTMENT_OTHER): Payer: Medicare Other | Admitting: Lab

## 2013-11-28 ENCOUNTER — Encounter: Payer: Self-pay | Admitting: Hematology & Oncology

## 2013-11-28 ENCOUNTER — Ambulatory Visit (HOSPITAL_BASED_OUTPATIENT_CLINIC_OR_DEPARTMENT_OTHER): Payer: Medicare Other | Admitting: Hematology & Oncology

## 2013-11-28 VITALS — BP 122/61 | HR 77 | Temp 98.1°F | Resp 14 | Ht 63.0 in | Wt 110.0 lb

## 2013-11-28 DIAGNOSIS — D591 Autoimmune hemolytic anemia, unspecified: Secondary | ICD-10-CM

## 2013-11-28 LAB — CBC WITH DIFFERENTIAL (CANCER CENTER ONLY)
BASO#: 0.1 10*3/uL (ref 0.0–0.2)
BASO%: 0.8 % (ref 0.0–2.0)
EOS%: 0.6 % (ref 0.0–7.0)
Eosinophils Absolute: 0 10*3/uL (ref 0.0–0.5)
HCT: 33.7 % — ABNORMAL LOW (ref 34.8–46.6)
HGB: 11.1 g/dL — ABNORMAL LOW (ref 11.6–15.9)
LYMPH#: 0.7 10*3/uL — ABNORMAL LOW (ref 0.9–3.3)
LYMPH%: 10.2 % — ABNORMAL LOW (ref 14.0–48.0)
MCH: 31.5 pg (ref 26.0–34.0)
MCHC: 32.9 g/dL (ref 32.0–36.0)
MCV: 96 fL (ref 81–101)
MONO#: 0.4 10*3/uL (ref 0.1–0.9)
MONO%: 6.8 % (ref 0.0–13.0)
NEUT#: 5.3 10*3/uL (ref 1.5–6.5)
NEUT%: 81.6 % — ABNORMAL HIGH (ref 39.6–80.0)
Platelets: 275 10*3/uL (ref 145–400)
RBC: 3.52 10*6/uL — ABNORMAL LOW (ref 3.70–5.32)
RDW: 14.9 % (ref 11.1–15.7)
WBC: 6.5 10*3/uL (ref 3.9–10.0)

## 2013-11-28 LAB — RETICULOCYTES (CHCC)
ABS RETIC: 84.2 10*3/uL (ref 19.0–186.0)
RBC.: 3.66 MIL/uL — AB (ref 3.87–5.11)
RETIC CT PCT: 2.3 % (ref 0.4–2.3)

## 2013-11-28 LAB — CHCC SATELLITE - SMEAR

## 2013-11-28 LAB — HOLD TUBE, BLOOD BANK - CHCC SATELLITE

## 2013-11-28 NOTE — Progress Notes (Signed)
Pt using a cane for support

## 2013-11-29 NOTE — Progress Notes (Signed)
CC:   Claris Gower, M.D.  DIAGNOSIS:  Autoimmune hemolytic anemia.  CURRENT THERAPY: 1. Prednisone 10 mg p.o. daily. 2. Folic acid 1 mg p.o. daily.  INTERIM HISTORY:  Nicole Harvey comes in for followup.  She continues to do incredibly well.  Her autoimmune hemolytic anemia has responded very nicely to prednisone.  Her hemoglobin when we first started was probably about 7 or so.  We were transfusing her every 3 weeks.  We finally were able to uncover the problem and subsequently we have been able to help her out.  Her reticulocyte count has also improved.  Back in November, the reticulocyte count was down to about 2.  She has had a good appetite.  She enjoyed the holidays.  She still has dementia.  However, this appears to be somewhat better.  PHYSICAL EXAMINATION:  General:  This is a petite white female, in no obvious distress.  Vital Signs:  Temperature of 98.1, pulse 77, respiratory rate 14, blood pressure 120/61, weight is 110 pounds.  Head and Neck:  Normocephalic, atraumatic skull.  There are no ocular or oral lesions.  There are no palpable cervical or supraclavicular lymph nodes. Lungs:  Clear bilaterally.  Cardiac:  Regular rate and rhythm with an occasional extra beat.  She has a 3/6 systolic ejection murmur. Abdomen:  Soft.  She has good bowel sounds.  There is no fluid wave. There is no palpable abdominal mass.  There is no palpable hepatosplenomegaly.  Back:  No tenderness over the spine, ribs, or hips. Extremities:  Show no clubbing, cyanosis, or edema.  Neurological: Shows no focal neurological deficits.  LABORATORY DATA:  White count is 6.5, hemoglobin 11.1, hematocrit 33.7, platelet count 275.  MCV is 96.  IMPRESSION:  Nicole Harvey is a very charming 80 year old white female with autoimmune hemolytic anemia.  She has responded incredibly well to prednisone.  We will decrease the prednisone dose down to 10 mg a day now.  I will see her back in 6 weeks.   I do not think we need any blood work in between visits.   ______________________________ Volanda Napoleon, M.D. PRE/MEDQ  D:  11/27/2013  T:  11/29/2013  Job:  [id number]

## 2013-12-08 ENCOUNTER — Ambulatory Visit: Payer: Medicare Other | Admitting: Cardiology

## 2013-12-26 ENCOUNTER — Ambulatory Visit
Admission: RE | Admit: 2013-12-26 | Discharge: 2013-12-26 | Disposition: A | Discharge status: Home / Self Care | Payer: Medicare Other | Source: Ambulatory Visit | Attending: Physician Assistant | Admitting: Physician Assistant

## 2013-12-26 ENCOUNTER — Other Ambulatory Visit: Payer: Self-pay | Admitting: Physician Assistant

## 2013-12-26 DIAGNOSIS — M25552 Pain in left hip: Secondary | ICD-10-CM

## 2014-01-09 ENCOUNTER — Encounter: Payer: Medicare Other | Admitting: Hematology & Oncology

## 2014-01-09 ENCOUNTER — Other Ambulatory Visit: Payer: Medicare Other | Admitting: Lab

## 2014-01-30 ENCOUNTER — Telehealth: Payer: Self-pay | Admitting: Hematology & Oncology

## 2014-01-30 NOTE — Telephone Encounter (Signed)
Patient's son called and resch 01/09/14 missed apt for 02/26/14

## 2014-02-10 ENCOUNTER — Emergency Department (HOSPITAL_COMMUNITY): Payer: Medicare Other

## 2014-02-10 ENCOUNTER — Emergency Department (HOSPITAL_COMMUNITY)
Admission: EM | Admit: 2014-02-10 | Discharge: 2014-02-10 | Disposition: A | Discharge status: Home / Self Care | Payer: Medicare Other | Attending: Emergency Medicine | Admitting: Emergency Medicine

## 2014-02-10 ENCOUNTER — Encounter (HOSPITAL_COMMUNITY): Payer: Self-pay | Admitting: Emergency Medicine

## 2014-02-10 DIAGNOSIS — R296 Repeated falls: Secondary | ICD-10-CM | POA: Insufficient documentation

## 2014-02-10 DIAGNOSIS — Y939 Activity, unspecified: Secondary | ICD-10-CM | POA: Insufficient documentation

## 2014-02-10 DIAGNOSIS — M19049 Primary osteoarthritis, unspecified hand: Secondary | ICD-10-CM | POA: Insufficient documentation

## 2014-02-10 DIAGNOSIS — S22009A Unspecified fracture of unspecified thoracic vertebra, initial encounter for closed fracture: Secondary | ICD-10-CM | POA: Insufficient documentation

## 2014-02-10 DIAGNOSIS — D591 Autoimmune hemolytic anemia, unspecified: Secondary | ICD-10-CM | POA: Insufficient documentation

## 2014-02-10 DIAGNOSIS — S22080A Wedge compression fracture of T11-T12 vertebra, initial encounter for closed fracture: Secondary | ICD-10-CM

## 2014-02-10 DIAGNOSIS — Z85828 Personal history of other malignant neoplasm of skin: Secondary | ICD-10-CM | POA: Insufficient documentation

## 2014-02-10 DIAGNOSIS — F0390 Unspecified dementia without behavioral disturbance: Secondary | ICD-10-CM | POA: Insufficient documentation

## 2014-02-10 DIAGNOSIS — Y929 Unspecified place or not applicable: Secondary | ICD-10-CM | POA: Insufficient documentation

## 2014-02-10 DIAGNOSIS — Z79899 Other long term (current) drug therapy: Secondary | ICD-10-CM | POA: Insufficient documentation

## 2014-02-10 DIAGNOSIS — Z87442 Personal history of urinary calculi: Secondary | ICD-10-CM | POA: Insufficient documentation

## 2014-02-10 DIAGNOSIS — R011 Cardiac murmur, unspecified: Secondary | ICD-10-CM | POA: Insufficient documentation

## 2014-02-10 DIAGNOSIS — Z8701 Personal history of pneumonia (recurrent): Secondary | ICD-10-CM | POA: Insufficient documentation

## 2014-02-10 DIAGNOSIS — Z8639 Personal history of other endocrine, nutritional and metabolic disease: Secondary | ICD-10-CM | POA: Insufficient documentation

## 2014-02-10 DIAGNOSIS — Z862 Personal history of diseases of the blood and blood-forming organs and certain disorders involving the immune mechanism: Secondary | ICD-10-CM | POA: Insufficient documentation

## 2014-02-10 MED ORDER — HYDROCODONE-ACETAMINOPHEN 5-325 MG PO TABS: 1.0000 | ORAL_TABLET | ORAL | Status: DC | PRN

## 2014-02-10 MED ORDER — ONDANSETRON 4 MG PO TBDP
4.0000 mg | ORAL_TABLET | Freq: Once | ORAL | Status: AC
  Administered 2014-02-10: 4 mg via ORAL
  Filled 2014-02-10: qty 1

## 2014-02-10 MED ORDER — METHOCARBAMOL 500 MG PO TABS
1000.0000 mg | ORAL_TABLET | Freq: Once | ORAL | Status: AC
Start: 2014-02-10 — End: 2014-02-10
  Administered 2014-02-10: 1000 mg via ORAL
  Filled 2014-02-10: qty 2

## 2014-02-10 MED ORDER — IBUPROFEN 400 MG PO TABS
400.0000 mg | ORAL_TABLET | Freq: Once | ORAL | Status: AC
  Administered 2014-02-10: 400 mg via ORAL
  Filled 2014-02-10: qty 1

## 2014-02-10 MED ORDER — METHOCARBAMOL 500 MG PO TABS: 500.0000 mg | ORAL_TABLET | Freq: Three times a day (TID) | ORAL | Status: DC | PRN

## 2014-02-10 MED ORDER — HYDROCODONE-ACETAMINOPHEN 5-325 MG PO TABS
1.0000 | ORAL_TABLET | Freq: Once | ORAL | Status: AC
  Administered 2014-02-10: 1 via ORAL
  Filled 2014-02-10: qty 1

## 2014-02-10 NOTE — ED Notes (Signed)
Patient transported to CT 

## 2014-02-10 NOTE — ED Notes (Signed)
Patient returned from X-ray 

## 2014-02-10 NOTE — ED Notes (Addendum)
Per EMS, history of chronic lumbar back pain; woke up this morning in spasm in lower back. Pain does not radiate, no weakness. Patient reports no history of back pain until a fall a week ago.

## 2014-02-10 NOTE — ED Notes (Signed)
Patient transported to X-ray 

## 2014-02-10 NOTE — ED Notes (Signed)
Dr. Jeneen Rinks at bedside updating patient and family.

## 2014-02-10 NOTE — Discharge Instructions (Signed)
Being upright or prolonged sitting. Recheck with her primary care physician as needed. Recheck with neurosurgery, Dr.Nundkmar if still symptomatic 7-10 days.  Back, Compression Fracture A compression fracture happens when a force is put upon the length of your spine. Slipping and falling on your bottom are examples of such a force. When this happens, sometimes the force is great enough to compress the building blocks (vertebral bodies) of your spine. Although this causes a lot of pain, this can usually be treated at home, unless your caregiver feels hospitalization is needed for pain control. Your backbone (spinal column) is made up of 24 main vertebral bodies in addition to the sacrum and coccyx (see illustration). These are held together by tough fibrous tissues (ligaments) and by support of your muscles. Nerve roots pass through the openings between the vertebrae. A sudden wrenching move, injury, or a fall may cause a compression fracture of one of the vertebral bodies. This may result in back pain or spread of pain into the belly (abdomen), the buttocks, and down the leg into the foot. Pain may also be created by muscle spasm alone. Large studies have been undertaken to determine the best possible course of action to help your back following injury and also to prevent future problems. The recommendations are as follows. FOLLOWING A COMPRESSION FRACTURE: Do the following only if advised by your caregiver.   If a back brace has been suggested or provided, wear it as directed.  DO NOT stop wearing the back brace unless instructed by your caregiver.  When allowed to return to regular activities, avoid a sedentary life style. Actively exercise. Sporadic weekend binges of tennis, racquetball, water skiing, may actually aggravate or create problems, especially if you are not in condition for that activity.  Avoid sports requiring sudden body movements until you are in condition for them. Swimming and  walking are safer activities.  Maintain good posture.  Avoid obesity.  If not already done, you should have a DEXA scan. Based on the results, be treated for osteoporosis. FOLLOWING ACUTE (SUDDEN) INJURY:  Only take over-the-counter or prescription medicines for pain, discomfort, or fever as directed by your caregiver.  Use bed rest for only the most extreme acute episode. Prolonged bed rest may aggravate your condition. Ice used for acute conditions is effective. Use a large plastic bag filled with ice. Wrap it in a towel. This also provides excellent pain relief. This may be continuous. Or use it for 30 minutes every 2 hours during acute phase, then as needed. Heat for 30 minutes prior to activities is helpful.  As soon as the acute phase (the time when your back is too painful for you to do normal activities) is over, it is important to resume normal activities and work [employer] information centre manager. Back injuries can cause potentially marked changes in lifestyle. So it is important to attack these problems aggressively.  See your caregiver for continued problems. He or she can help or refer you for appropriate exercises, physical therapy and work hardening if needed.  If you are given narcotic medications for your condition, for the next 24 hours DO NOT:  Drive  Operate machinery or power tools.  Sign legal documents.  DO NOT drink alcohol, take sleeping pills or other medications that may interfere with treatment. If your caregiver has given you a follow-up appointment, it is very important to keep that appointment. Not keeping the appointment could result in a chronic or permanent injury, pain, and disability. If there is any  problem keeping the appointment, you must call back to this facility for assistance.  SEEK IMMEDIATE MEDICAL CARE IF:  You develop numbness, tingling, weakness, or problems with the use of your arms or legs.  You develop severe back pain not relieved with  medications.  You have changes in bowel or bladder control.  You have increasing pain in any areas of the body. Document Released: 11/02/2005 Document Revised: 01/25/2012 Document Reviewed: 06/06/2008 [facility] Patient Information 2014 [place].

## 2014-02-10 NOTE — ED Provider Notes (Signed)
CSN: [id number]     Arrival date & time 02/10/14  1149 History   First MD Initiated Contact with Patient 02/10/14 1154     Chief Complaint  Patient presents with  . Back Pain     ) HPI  Patient presents with a complaint of back pain. Upon her arrival stated she had had a fall about 7 days ago. She does have a history of dementia. Ultimately her family arrives and is able to clarify. She actually had a fall about 6 or 8 weeks ago. She had normal outpatient x-rays obtained per the son's report. She continues to have pain. She's not incontinent retaining urine the stool. She has no numbness weakness tingling to the extremities. Localized back pain.  Past Medical History  Diagnosis Date  . Anemia   . PONV (postoperative nausea and vomiting)   . 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 Family history of anesthesia complication     "son did; don't remember what reaction was though" (10/25/2013)  . High cholesterol   . Heart murmur   . Pneumonia     "when I was a little girl" (10/25/2013)  . History of blood transfusion     "quite a few; related to Myelodysplastic" (10/25/2013)  . Dementia     "don't know kind or stage" (10/25/2013)  . Arthritis     "hands" (10/25/2013)  . Kidney stones   . Myelodysplastic syndrome, low grade 10/09/2011  . Skin cancer of face     "several cut off face" (10/25/2013)   Past Surgical History  Procedure Laterality Date  . Kidney stone surgery  ?1970's  . Tonsillectomy    . Vaginal hysterectomy     History reviewed. No pertinent family history. History  Substance Use Topics  . Smoking status: Never Smoker   . Smokeless tobacco: Never Used  . Alcohol Use: No   OB History   Grav Para Term Preterm Abortions TAB SAB Ect Mult Living                 Review of Systems  Constitutional: Negative for fever, chills, diaphoresis, appetite change and fatigue.  HENT: Negative for mouth sores, sore throat and trouble swallowing.   Eyes: Negative  for visual disturbance.  Respiratory: Negative for cough, chest tightness, shortness of breath and wheezing.   Cardiovascular: Negative for chest pain.  Gastrointestinal: Negative for nausea, vomiting, abdominal pain, diarrhea and abdominal distention.  Endocrine: Negative for polydipsia, polyphagia and polyuria.  Genitourinary: Negative for dysuria, frequency and hematuria.  Musculoskeletal: Positive for back pain. Negative for gait problem.  Skin: Negative for color change, pallor and rash.  Neurological: Negative for dizziness, syncope, light-headedness and headaches.  Hematological: Does not bruise/bleed easily.  Psychiatric/Behavioral: Negative for behavioral problems and confusion.      Allergies  Other and Codeine  Home Medications   Current Outpatient Rx  Name  Route  Sig  Dispense  Refill  . folic acid (FOLVITE) 1 MG tablet   Oral   Take 2 tablets (2 mg total) by mouth daily.   60 tablet   4   . pantoprazole (PROTONIX) 40 MG tablet   Oral   Take 1 tablet (40 mg total) by mouth daily.   30 tablet   3   . traMADol (ULTRAM) 50 MG tablet   Oral   Take 50-100 mg by mouth every 4 (four) hours as needed (pain).         Marland Kitchen HYDROcodone-acetaminophen (NORCO/VICODIN) 5-325 MG per  tablet   Oral   Take 1 tablet by mouth every 4 (four) hours as needed.   20 tablet   0   . methocarbamol (ROBAXIN) 500 MG tablet   Oral   Take 1 tablet (500 mg total) by mouth 3 (three) times daily between meals as needed.   20 tablet   0    BP 111/72  Pulse 69  Temp(Src) 97.5 F (36.4 C) (Oral)  Resp 18  Ht 5\' 5"  (1.651 m)  Wt 112 lb (50.803 kg)  BMI 18.64 kg/m2  SpO2 96% Physical Exam  Constitutional: She appears well-developed and well-nourished. No distress.  HENT:  Head: Normocephalic.  Eyes: Conjunctivae are normal. Pupils are equal, round, and reactive to light. No scleral icterus.  Neck: Normal range of motion. Neck supple. No thyromegaly present.  Cardiovascular:  Normal rate and regular rhythm.  Exam reveals no gallop and no friction rub.   No murmur heard. Pulmonary/Chest: Effort normal and breath sounds normal. No respiratory distress. She has no wheezes. She has no rales.  Abdominal: Soft. Bowel sounds are normal. She exhibits no distension. There is no tenderness. There is no rebound.  Musculoskeletal: Normal range of motion.       Back:  Neurological: She is alert.  Normal neuro exam spine with no deficits of strength or sensation lower extremities. Symmetric DTRs 1-2+.  Skin: Skin is warm and dry. No rash noted.  Psychiatric: She has a normal mood and affect. Her behavior is normal.    ED Course  Procedures (including critical care time) Labs Review Labs Reviewed - No data to display Imaging Review Dg Lumbar Spine Complete  02/10/2014   CLINICAL DATA:  Back pain, fall.  EXAM: LUMBAR SPINE - COMPLETE 4+ VIEW  COMPARISON:  None.  FINDINGS: Severe diffuse osteopenia. Moderate to severe compression fracture with wedge deformity at T12. Probable mild compression through the superior endplate of L1 and L2. Slight anterolisthesis of L5 on S1, likely related to facet disease. SI joints are symmetric and unremarkable.  IMPRESSION: Moderate compression fracture at T12. Slight superior endplate compression fractures at L1 and L2.  Severe diffuse osteopenia.   Electronically Signed   By: Rolm Baptise M.D.   On: 02/10/2014 13:19   Ct Lumbar Spine Wo Contrast  02/10/2014   CLINICAL DATA:  T12 compression fracture.  Fall 1 week ago.  EXAM: CT LUMBAR SPINE WITHOUT CONTRAST  TECHNIQUE: Multidetector CT imaging of the lumbar spine was performed without intravenous contrast administration. Multiplanar CT image reconstructions were also generated.  COMPARISON:  DG LUMBAR SPINE COMPLETE dated 02/10/2014; DG PELVIS 1-2 VIEWS dated 12/26/2013  FINDINGS: Severe bony demineralization. 70% compression fracture of T12 most severe centrally with involvement of both the  superior and inferior endplate, but with only 1-2 mm of posterior bony retropulsion. There is some sclerosis in the T12 vertebral body favoring a subacute component of the fracture, along with the mild amount of paraspinal hematoma. 1.9 cm rim calcified lesion along the left renal hilum medially may reflect a renal artery aneurysm or a rim calcified complex exophytic lesion from the kidney. No obvious involvement of the posterior elements.  20% superior endplate compression fracture of L1, age indeterminate, with 1-2 mm of posterior retropulsion.  Considerable loss of disc height at L2-3 with vacuum disc phenomenon.  5 mm of degenerative grade 1 anterolisthesis at L5-S1.  Bilateral sacral insufficiency fractures in the sacral ala.  Hypodense lesion posteriorly in the right hepatic lobe, 1.2 x 1.6 cm  on image 30 of series 3, density 14 Hounsfield units.  Bilateral nonobstructive nephrolithiasis with faint suggestion of mild nephrocalcinosis.  IMPRESSION: 1. 70% compression fracture of T12 is likely acute/subacute given the paraspinal hematoma and sclerotic appearance. 1-2 mm of posterior bony retropulsion. 2. 20% superior endplate compression fracture of L1, probably chronic. 3. 1.9 cm rain calcified soft tissue density structure along the left medial renal hilum, possibly a renal artery aneurysm a or complex exophytic lesion from the left kidney. This could be further characterized with renal protocol CT or MRI with and without contrast. 4. Bilateral sacral insufficiency fractures in the sacral ala. 5. Bilateral nonobstructive nephrolithiasis, potentially with mild nephrocalcinosis. 6. The compression fractures indicate osteoporosis.   Electronically Signed   By: Sherryl Barters M.D.   On: 02/10/2014 15:12     EKG Interpretation None      MDM   Final diagnoses:  Compression fracture of T12 vertebra    Patient is tolerating symptoms well. CT scan shows subacute T12 compression. We discussed putting up  her activity, prolonged sitting. Neurosurgical followup in 7-10 days is still symptomatic. Pain control with Vicodin. They were to follow up if still painful for consideration of outpatient kyphoplasty. This can be discussed with her primary care physician, or neurosurgeon.    Tanna Furry, MD 02/10/14 [phone]

## 2014-02-10 NOTE — ED Notes (Signed)
Patient returned from CT

## 2014-02-26 ENCOUNTER — Ambulatory Visit: Payer: Medicare Other | Admitting: Hematology & Oncology

## 2014-02-26 ENCOUNTER — Other Ambulatory Visit: Payer: Medicare Other | Admitting: Lab

## 2014-02-28 ENCOUNTER — Telehealth: Payer: Self-pay | Admitting: Hematology & Oncology

## 2014-02-28 NOTE — Telephone Encounter (Signed)
Left message on son's cell to call and reschedule appointment. Pt is also aware and will have her son call us.

## 2014-03-10 NOTE — Progress Notes (Signed)
This encounter was created in error - please disregard.

## 2014-03-21 ENCOUNTER — Other Ambulatory Visit: Payer: Self-pay | Admitting: Hematology & Oncology

## 2014-05-01 ENCOUNTER — Other Ambulatory Visit: Payer: Self-pay | Admitting: Family Medicine

## 2014-05-01 DIAGNOSIS — I722 Aneurysm of renal artery: Secondary | ICD-10-CM

## 2014-05-03 ENCOUNTER — Inpatient Hospital Stay: Admission: RE | Admit: 2014-05-03 | Payer: Medicare Other | Source: Ambulatory Visit

## 2014-05-03 ENCOUNTER — Ambulatory Visit
Admission: RE | Admit: 2014-05-03 | Discharge: 2014-05-03 | Disposition: A | Discharge status: Home / Self Care | Payer: Medicare Other | Source: Ambulatory Visit | Attending: Family Medicine | Admitting: Family Medicine

## 2014-05-03 DIAGNOSIS — I722 Aneurysm of renal artery: Secondary | ICD-10-CM

## 2014-05-03 MED ORDER — IOHEXOL 350 MG/ML SOLN: 100.0000 mL | Freq: Once | INTRAVENOUS | Status: DC | PRN

## 2014-05-03 MED ORDER — IOHEXOL 350 MG/ML SOLN
80.0000 mL | Freq: Once | INTRAVENOUS | Status: AC | PRN
  Administered 2014-05-03: 80 mL via INTRAVENOUS

## 2014-05-08 ENCOUNTER — Other Ambulatory Visit: Payer: Self-pay | Admitting: Family Medicine

## 2014-05-08 DIAGNOSIS — I722 Aneurysm of renal artery: Secondary | ICD-10-CM

## 2014-05-11 ENCOUNTER — Other Ambulatory Visit: Payer: Self-pay | Admitting: Family Medicine

## 2014-05-11 DIAGNOSIS — M810 Age-related osteoporosis without current pathological fracture: Secondary | ICD-10-CM

## 2014-05-23 ENCOUNTER — Other Ambulatory Visit: Payer: Medicare Other

## 2014-05-24 ENCOUNTER — Ambulatory Visit
Admission: RE | Admit: 2014-05-24 | Discharge: 2014-05-24 | Disposition: A | Discharge status: Home / Self Care | Payer: Medicare Other | Source: Ambulatory Visit | Attending: Family Medicine | Admitting: Family Medicine

## 2014-05-24 DIAGNOSIS — I722 Aneurysm of renal artery: Secondary | ICD-10-CM

## 2014-07-06 ENCOUNTER — Other Ambulatory Visit: Payer: Self-pay | Admitting: Hematology & Oncology

## 2014-08-16 ENCOUNTER — Other Ambulatory Visit: Payer: Self-pay | Admitting: Nurse Practitioner

## 2014-08-16 MED ORDER — PANTOPRAZOLE SODIUM 40 MG PO TBEC: DELAYED_RELEASE_TABLET | ORAL | Status: DC

## 2014-09-06 ENCOUNTER — Ambulatory Visit: Payer: Self-pay | Admitting: Orthopedic Surgery

## 2014-09-10 ENCOUNTER — Encounter (HOSPITAL_COMMUNITY)
Admission: RE | Admit: 2014-09-10 | Discharge: 2014-09-10 | Disposition: A | Discharge status: Home / Self Care | Payer: Medicare Other | Source: Ambulatory Visit | Attending: Orthopedic Surgery | Admitting: Orthopedic Surgery

## 2014-09-10 ENCOUNTER — Encounter (HOSPITAL_COMMUNITY): Payer: Self-pay

## 2014-09-10 DIAGNOSIS — R011 Cardiac murmur, unspecified: Secondary | ICD-10-CM

## 2014-09-10 LAB — ABO/RH: ABO/RH(D): O NEG

## 2014-09-10 LAB — BASIC METABOLIC PANEL
ANION GAP: 14 (ref 5–15)
BUN: 14 mg/dL (ref 6–23)
CALCIUM: 9.3 mg/dL (ref 8.4–10.5)
CO2: 24 mEq/L (ref 19–32)
Chloride: 104 mEq/L (ref 96–112)
Creatinine, Ser: 0.49 mg/dL — ABNORMAL LOW (ref 0.50–1.10)
GFR calc Af Amer: >90 mL/min (ref >90)
GFR calc non Af Amer: 87 mL/min — ABNORMAL LOW (ref >90)
Glucose, Bld: 156 mg/dL — ABNORMAL HIGH (ref 70–99)
Potassium: 3.8 mEq/L (ref 3.7–5.3)
SODIUM: 142 meq/L (ref 137–147)

## 2014-09-10 LAB — CBC
HEMATOCRIT: 36.1 % (ref 36.0–46.0)
Hemoglobin: 11.4 g/dL — ABNORMAL LOW (ref 12.0–15.0)
MCH: 29.2 pg (ref 26.0–34.0)
MCHC: 31.6 g/dL (ref 30.0–36.0)
MCV: 92.6 fL (ref 78.0–100.0)
PLATELETS: 288 10*3/uL (ref 150–400)
RBC: 3.9 MIL/uL (ref 3.87–5.11)
RDW: 15 % (ref 11.5–15.5)
WBC: 8.3 10*3/uL (ref 4.0–10.5)

## 2014-09-10 LAB — TYPE AND SCREEN
ABO/RH(D): O NEG
ANTIBODY SCREEN: NEGATIVE

## 2014-09-10 LAB — SURGICAL PCR SCREEN
MRSA, PCR: NEGATIVE
STAPHYLOCOCCUS AUREUS: NEGATIVE

## 2014-09-10 NOTE — Pre-Procedure Instructions (Signed)
ADARA KITTLE  09/10/2014   Your procedure is scheduled on:  09-14-2014    Friday   Report to [facility] Admitting at 11:30 AM.  Call this number if you have problems the morning of surgery: [phone]   Remember:   Do not eat food or drink liquids after midnight.    Take these medicines the morning of surgery with A SIP OF WATER: Pain medication if needed,methocarbamol (robaxin) as needed,protonix   Do not wear jewelry, make-up or nail polish.  Do not wear lotions, powders, or perfumes. You may not wear deodorant.  Do not shave 48 hours prior to surgery. Men may shave face and neck.  Do not bring valuables to the hospital.  [facility] is not responsible for any belongings or valuables.               Contacts, dentures or bridgework may not be worn into surgery.   Leave suitcase in the car. After surgery it may be brought to your room.  For patients admitted to the hospital, discharge time is determined by you  treatment team.                   Special Instructions: See attached sheet for instructions on CHG shower/bath   Please read over the following fact sheets that you were given: Pain Booklet, Coughing and Deep Breathing, Blood Transfusion Information and Surgical Site Infection Prevention

## 2014-09-10 NOTE — Progress Notes (Signed)
Will repeat cxr today. Also pt is seeing PCP tomorrow.

## 2014-09-11 ENCOUNTER — Telehealth: Payer: Self-pay | Admitting: Internal Medicine

## 2014-09-11 NOTE — Telephone Encounter (Signed)
Spoke with [employer]. Informed her that Dr. Debara Pickett is not even in the office until 10/29 and has no new patient appointment. Nicole Harvey states that Dr. Arelia Sneddon (PCP) did NOT clear patient for surgery and referred her for cardiac clearance workup. Per schedule notes there is a h/o aortic stenosis/chest pain. Informed Nicole Harvey that if this is the case, a cardiac workup (likely including a stress test) would be ordered.   Patient is to have a bipolar hip (?) r/t fractured femoral head.  Nicole Harvey Forget if patient needs sooner appointment, since Dr. Debara Pickett is not in office until 10/29 and surgery is 10/30, that she contact scheduling department to see if sooner new patient/cardiac clearance appointment is available.

## 2014-09-11 NOTE — Telephone Encounter (Signed)
Received 6 pages records on patient from [place] (Dr Arelia Sneddon) for appointment with Dr Debara Pickett on 10//30/15 .  Records given to [facility] (medical records) for Dr [facility] schedule on 09/14/14  lp

## 2014-09-11 NOTE — Telephone Encounter (Signed)
Sheri called in stating that the pt will be having semi hip surgery for a hip fracture on 10/30 and would like to know if she could come in and be seen sooner to be cleared for surgery. Please call  thanks

## 2014-09-12 ENCOUNTER — Other Ambulatory Visit: Payer: Self-pay | Admitting: Orthopedic Surgery

## 2014-09-12 ENCOUNTER — Encounter (HOSPITAL_COMMUNITY): Payer: Self-pay | Admitting: Pharmacy Technician

## 2014-09-13 ENCOUNTER — Encounter (HOSPITAL_COMMUNITY): Payer: Self-pay | Admitting: Physician Assistant

## 2014-09-13 ENCOUNTER — Inpatient Hospital Stay (HOSPITAL_COMMUNITY)
Admission: AD | Admit: 2014-09-13 | Discharge: 2014-09-18 | DRG: 469 | Disposition: A | Discharge status: Skilled Nursing Facility | Payer: Medicare Other | Source: Ambulatory Visit | Attending: Internal Medicine | Admitting: Internal Medicine

## 2014-09-13 DIAGNOSIS — G8918 Other acute postprocedural pain: Secondary | ICD-10-CM | POA: Insufficient documentation

## 2014-09-13 DIAGNOSIS — Z01818 Encounter for other preprocedural examination: Secondary | ICD-10-CM | POA: Diagnosis not present

## 2014-09-13 DIAGNOSIS — Z885 Allergy status to narcotic agent status: Secondary | ICD-10-CM

## 2014-09-13 DIAGNOSIS — R627 Adult failure to thrive: Secondary | ICD-10-CM | POA: Diagnosis present

## 2014-09-13 DIAGNOSIS — R634 Abnormal weight loss: Secondary | ICD-10-CM | POA: Insufficient documentation

## 2014-09-13 DIAGNOSIS — F0391 Unspecified dementia with behavioral disturbance: Secondary | ICD-10-CM | POA: Diagnosis present

## 2014-09-13 DIAGNOSIS — Z79899 Other long term (current) drug therapy: Secondary | ICD-10-CM | POA: Diagnosis not present

## 2014-09-13 DIAGNOSIS — K219 Gastro-esophageal reflux disease without esophagitis: Secondary | ICD-10-CM | POA: Diagnosis present

## 2014-09-13 DIAGNOSIS — Z888 Allergy status to other drugs, medicaments and biological substances status: Secondary | ICD-10-CM

## 2014-09-13 DIAGNOSIS — S72141A Displaced intertrochanteric fracture of right femur, initial encounter for closed fracture: Secondary | ICD-10-CM | POA: Diagnosis present

## 2014-09-13 DIAGNOSIS — S72001K Fracture of unspecified part of neck of right femur, subsequent encounter for closed fracture with nonunion: Secondary | ICD-10-CM

## 2014-09-13 DIAGNOSIS — R410 Disorientation, unspecified: Secondary | ICD-10-CM | POA: Diagnosis present

## 2014-09-13 DIAGNOSIS — Z881 Allergy status to other antibiotic agents status: Secondary | ICD-10-CM | POA: Diagnosis not present

## 2014-09-13 DIAGNOSIS — D469 Myelodysplastic syndrome, unspecified: Secondary | ICD-10-CM | POA: Diagnosis present

## 2014-09-13 DIAGNOSIS — Z01811 Encounter for preprocedural respiratory examination: Secondary | ICD-10-CM

## 2014-09-13 DIAGNOSIS — R0789 Other chest pain: Secondary | ICD-10-CM

## 2014-09-13 DIAGNOSIS — E785 Hyperlipidemia, unspecified: Secondary | ICD-10-CM | POA: Diagnosis present

## 2014-09-13 DIAGNOSIS — Z01810 Encounter for preprocedural cardiovascular examination: Secondary | ICD-10-CM | POA: Diagnosis not present

## 2014-09-13 DIAGNOSIS — I5032 Chronic diastolic (congestive) heart failure: Secondary | ICD-10-CM | POA: Diagnosis present

## 2014-09-13 DIAGNOSIS — J9811 Atelectasis: Secondary | ICD-10-CM | POA: Diagnosis not present

## 2014-09-13 DIAGNOSIS — M8580 Other specified disorders of bone density and structure, unspecified site: Secondary | ICD-10-CM | POA: Diagnosis present

## 2014-09-13 DIAGNOSIS — M810 Age-related osteoporosis without current pathological fracture: Secondary | ICD-10-CM | POA: Diagnosis present

## 2014-09-13 DIAGNOSIS — R400 Somnolence: Secondary | ICD-10-CM | POA: Diagnosis present

## 2014-09-13 DIAGNOSIS — Z7952 Long term (current) use of systemic steroids: Secondary | ICD-10-CM | POA: Diagnosis not present

## 2014-09-13 DIAGNOSIS — Z01812 Encounter for preprocedural laboratory examination: Secondary | ICD-10-CM | POA: Diagnosis not present

## 2014-09-13 DIAGNOSIS — S72001A Fracture of unspecified part of neck of right femur, initial encounter for closed fracture: Secondary | ICD-10-CM

## 2014-09-13 DIAGNOSIS — I350 Nonrheumatic aortic (valve) stenosis: Secondary | ICD-10-CM | POA: Diagnosis present

## 2014-09-13 DIAGNOSIS — R339 Retention of urine, unspecified: Secondary | ICD-10-CM | POA: Diagnosis present

## 2014-09-13 DIAGNOSIS — X58XXXA Exposure to other specified factors, initial encounter: Secondary | ICD-10-CM | POA: Diagnosis present

## 2014-09-13 DIAGNOSIS — E43 Unspecified severe protein-calorie malnutrition: Secondary | ICD-10-CM | POA: Insufficient documentation

## 2014-09-13 DIAGNOSIS — F03918 Unspecified dementia, unspecified severity, with other behavioral disturbance: Secondary | ICD-10-CM | POA: Diagnosis present

## 2014-09-13 DIAGNOSIS — Z515 Encounter for palliative care: Secondary | ICD-10-CM | POA: Insufficient documentation

## 2014-09-13 DIAGNOSIS — Z66 Do not resuscitate: Secondary | ICD-10-CM | POA: Diagnosis present

## 2014-09-13 DIAGNOSIS — D589 Hereditary hemolytic anemia, unspecified: Secondary | ICD-10-CM | POA: Diagnosis present

## 2014-09-13 DIAGNOSIS — D46Z Other myelodysplastic syndromes: Secondary | ICD-10-CM | POA: Diagnosis present

## 2014-09-13 DIAGNOSIS — D4620 Refractory anemia with excess of blasts, unspecified: Secondary | ICD-10-CM | POA: Diagnosis present

## 2014-09-13 DIAGNOSIS — G9340 Encephalopathy, unspecified: Secondary | ICD-10-CM

## 2014-09-13 HISTORY — DX: Fracture of unspecified part of neck of right femur, initial encounter for closed fracture: S72.001A

## 2014-09-13 LAB — CBC WITH DIFFERENTIAL/PLATELET
BASOS ABS: 0.1 10*3/uL (ref 0.0–0.1)
BASOS PCT: 1 % (ref 0–1)
Eosinophils Absolute: 0.1 10*3/uL (ref 0.0–0.7)
Eosinophils Relative: 2 % (ref 0–5)
HCT: 35.6 % — ABNORMAL LOW (ref 36.0–46.0)
Hemoglobin: 11.6 g/dL — ABNORMAL LOW (ref 12.0–15.0)
Lymphocytes Relative: 12 % (ref 12–46)
Lymphs Abs: 0.8 10*3/uL (ref 0.7–4.0)
MCH: 29.3 pg (ref 26.0–34.0)
MCHC: 32.6 g/dL (ref 30.0–36.0)
MCV: 89.9 fL (ref 78.0–100.0)
MONO ABS: 0.6 10*3/uL (ref 0.1–1.0)
Monocytes Relative: 9 % (ref 3–12)
NEUTROS ABS: 5 10*3/uL (ref 1.7–7.7)
Neutrophils Relative %: 76 % (ref 43–77)
Platelets: 275 10*3/uL (ref 150–400)
RBC: 3.96 MIL/uL (ref 3.87–5.11)
RDW: 15.1 % (ref 11.5–15.5)
WBC: 6.5 10*3/uL (ref 4.0–10.5)

## 2014-09-13 LAB — URINALYSIS, ROUTINE W REFLEX MICROSCOPIC
BILIRUBIN URINE: NEGATIVE
Glucose, UA: NEGATIVE mg/dL
Hgb urine dipstick: NEGATIVE
Ketones, ur: NEGATIVE mg/dL
Nitrite: NEGATIVE
PH: 7 (ref 5.0–8.0)
Protein, ur: 30 mg/dL — AB
SPECIFIC GRAVITY, URINE: 1.017 (ref 1.005–1.030)
Urobilinogen, UA: 1 mg/dL (ref 0.0–1.0)

## 2014-09-13 LAB — COMPREHENSIVE METABOLIC PANEL
ALT: 18 U/L (ref 0–35)
ANION GAP: 13 (ref 5–15)
AST: 15 U/L (ref 0–37)
Albumin: 3.2 g/dL — ABNORMAL LOW (ref 3.5–5.2)
Alkaline Phosphatase: 337 U/L — ABNORMAL HIGH (ref 39–117)
BILIRUBIN TOTAL: 0.4 mg/dL (ref 0.3–1.2)
BUN: 12 mg/dL (ref 6–23)
CHLORIDE: 104 meq/L (ref 96–112)
CO2: 27 meq/L (ref 19–32)
Calcium: 9.3 mg/dL (ref 8.4–10.5)
Creatinine, Ser: 0.49 mg/dL — ABNORMAL LOW (ref 0.50–1.10)
GFR calc non Af Amer: 87 mL/min — ABNORMAL LOW (ref >90)
GLUCOSE: 134 mg/dL — AB (ref 70–99)
POTASSIUM: 3.5 meq/L — AB (ref 3.7–5.3)
Sodium: 144 mEq/L (ref 137–147)
Total Protein: 6.6 g/dL (ref 6.0–8.3)

## 2014-09-13 LAB — URINE MICROSCOPIC-ADD ON

## 2014-09-13 LAB — TROPONIN I: Troponin I: <0.3 ng/mL (ref <0.30)

## 2014-09-13 LAB — PROTIME-INR
INR: 1.14 (ref 0.00–1.49)
Prothrombin Time: 14.7 seconds (ref 11.6–15.2)

## 2014-09-13 MED ORDER — MORPHINE SULFATE 2 MG/ML IJ SOLN
2.0000 mg | INTRAMUSCULAR | Status: DC | PRN
  Administered 2014-09-14: 2 mg via INTRAVENOUS
  Filled 2014-09-13: qty 1

## 2014-09-13 MED ORDER — OXYCODONE HCL 5 MG PO TABS: 5.0000 mg | ORAL_TABLET | ORAL | Status: DC | PRN

## 2014-09-13 MED ORDER — HEPARIN SODIUM (PORCINE) 5000 UNIT/ML IJ SOLN
5000.0000 [IU] | Freq: Three times a day (TID) | INTRAMUSCULAR | Status: DC
  Administered 2014-09-13: 5000 [IU] via SUBCUTANEOUS
  Filled 2014-09-13 (×5): qty 1

## 2014-09-13 MED ORDER — LORAZEPAM 2 MG/ML IJ SOLN
0.5000 mg | Freq: Once | INTRAMUSCULAR | Status: AC
  Administered 2014-09-13: 0.5 mg via INTRAVENOUS
  Filled 2014-09-13: qty 1

## 2014-09-13 MED ORDER — SODIUM CHLORIDE 0.9 % IJ SOLN
3.0000 mL | Freq: Two times a day (BID) | INTRAMUSCULAR | Status: DC
  Administered 2014-09-13 – 2014-09-14 (×2): 3 mL via INTRAVENOUS

## 2014-09-13 MED ORDER — DOCUSATE SODIUM 100 MG PO CAPS
100.0000 mg | ORAL_CAPSULE | Freq: Two times a day (BID) | ORAL | Status: DC
  Administered 2014-09-14: 100 mg via ORAL
  Filled 2014-09-13 (×3): qty 1

## 2014-09-13 MED ORDER — ACETAMINOPHEN 650 MG RE SUPP: 650.0000 mg | Freq: Four times a day (QID) | RECTAL | Status: DC | PRN

## 2014-09-13 MED ORDER — ACETAMINOPHEN 325 MG PO TABS
650.0000 mg | ORAL_TABLET | Freq: Four times a day (QID) | ORAL | Status: DC | PRN
  Administered 2014-09-13: 650 mg via ORAL
  Filled 2014-09-13: qty 2

## 2014-09-13 MED ORDER — ALUM & MAG HYDROXIDE-SIMETH 200-200-20 MG/5ML PO SUSP: 30.0000 mL | Freq: Four times a day (QID) | ORAL | Status: DC | PRN

## 2014-09-13 MED ORDER — SODIUM CHLORIDE 0.9 % IJ SOLN
3.0000 mL | INTRAMUSCULAR | Status: DC | PRN
  Administered 2014-09-13: 3 mL via INTRAVENOUS

## 2014-09-13 MED ORDER — ONDANSETRON HCL 4 MG PO TABS: 4.0000 mg | ORAL_TABLET | Freq: Four times a day (QID) | ORAL | Status: DC | PRN

## 2014-09-13 MED ORDER — CEFAZOLIN SODIUM-DEXTROSE 2-3 GM-% IV SOLR
2.0000 g | INTRAVENOUS | Status: DC
  Filled 2014-09-13: qty 50

## 2014-09-13 MED ORDER — PANTOPRAZOLE SODIUM 40 MG PO TBEC
40.0000 mg | DELAYED_RELEASE_TABLET | Freq: Every day | ORAL | Status: DC
  Administered 2014-09-13 – 2014-09-14 (×2): 40 mg via ORAL
  Filled 2014-09-13 (×2): qty 1

## 2014-09-13 MED ORDER — ONDANSETRON HCL 4 MG/2ML IJ SOLN
4.0000 mg | Freq: Four times a day (QID) | INTRAMUSCULAR | Status: DC | PRN
Start: 2014-09-13 — End: 2014-09-14

## 2014-09-13 MED ORDER — PREDNISONE 10 MG PO TABS
10.0000 mg | ORAL_TABLET | Freq: Every day | ORAL | Status: DC
  Administered 2014-09-14 – 2014-09-15 (×2): 10 mg via ORAL
  Filled 2014-09-13 (×3): qty 1

## 2014-09-13 MED ORDER — SODIUM CHLORIDE 0.9 % IV SOLN: 250.0000 mL | INTRAVENOUS | Status: DC | PRN

## 2014-09-13 NOTE — Consult Note (Signed)
CARDIOLOGY CONSULT NOTE   Patient ID: Nicole Harvey MRN: [id number], DOB/AGE: Feb 07, 1928   Admit date: 09/13/2014 Date of Consult: 09/13/2014   Primary Physician: Leonard Downing, MD Primary Cardiologist: New   Pt. Profile  80 year old Caucasian female with past medical history of hyperlipidemia, autoimmune hemolytic anemia, low-grade myelodysplastic syndrome, dementia, osteoporosis and severe aortic stenosis presented with R intertrochanteric fx  Problem List  Past Medical History  Diagnosis Date  . Anemia   . PONV (postoperative nausea and vomiting)   . 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 Family history of anesthesia complication     "son did; don't remember what reaction was though" (10/25/2013)  . High cholesterol   . Heart murmur   . Pneumonia     "when I was a little girl" (10/25/2013)  . History of blood transfusion     "quite a few; related to Myelodysplastic" (10/25/2013)  . Dementia     "don't know kind or stage" (10/25/2013)  . Arthritis     "hands" (10/25/2013)  . Kidney stones   . Myelodysplastic syndrome, low grade 10/09/2011  . Skin cancer of face     "several cut off face" (10/25/2013)    Past Surgical History  Procedure Laterality Date  . Kidney stone surgery  ?1970's  . Tonsillectomy    . Vaginal hysterectomy       Allergies  Allergies  Allergen Reactions  . Other Shortness Of Breath and Itching    Med for ???? Instructed patient to bring.  . Codeine Nausea And Vomiting  . Epinephrine Other (See Comments)    Unknown  . Sulfa Antibiotics Other (See Comments)    Unknown    HPI   The patient is a 80 year old Caucasian female with past medical history of hyperlipidemia, autoimmune hemolytic anemia, low-grade myelodysplastic syndrome, dementia, osteoporosis and severe aortic stenosis. Her last echocardiogram on 04/29/2013 showed EF 78-29%, grade 1 diastolic dysfunction, severe aortic stenosis, PA peak pressure 33.  According to discharge summary on 04/29/2013, the result of the echocardiogram was discussed with the patient's son who prefer outpatient follow-up with his PCP and possible outpatient cardiology evaluation. It does not appear the patient has been evaluated by cardiology at this point for severe aortic stenosis. According to the son, she has not seen a cardiologist since. The patient has been following oncology for autoimmune hemolytic anemia and was placed on chronic course of prednisone for suppression.   During the last year, patient has been ambulating at home with a walker. She is not very active due to myriad of health issue including back pain and frailty. She does have chronic intermittent dizziness, however denies any significant presyncope, syncope, lower extremity edema, orthopnea or paroxysmal nocturnal dyspnea. She does experience chest pain on a rare occasion, however unclear if associated with exertion. The last episode was several weeks ago. She has never seen a cardiologist before, and denies any prior stress test or cardiac catheterization.   For the past 4 weeks, patient has been not being able to ambulate at all. She was found to have right intertrochanteric fracture with osteoporosis last week by Dr. Mardelle Matte. According to our medical record, patient's PCP try to schedule a preoperative visit was Dr. Debara Pickett, however since Dr. Debara Pickett was out of the clinic for the past week. She was admitted to the hospital on 09/13/2014 for orthopedic surgery. Orthopedic has requested a cardiology clearance for this patient was history of severe aortic stenosis.  Inpatient Medications  .  ceFAZolin (ANCEF)  IV  2 g Intravenous On Call to OR  . docusate sodium  100 mg Oral BID  . heparin  5,000 Units Subcutaneous 3 times per day  . pantoprazole  40 mg Oral Daily  . [START ON 09/14/2014] predniSONE  10 mg Oral Q breakfast  . sodium chloride  3 mL Intravenous Q12H  . sodium chloride  3 mL Intravenous Q12H     Family History Family History  Problem Relation Age of Onset  . Family history unknown: Yes   Per patient's son, patient's mother died of car accident, no known health issue. Family hx unknown due to patient's severe dementia. She believe her father is still alive, per patient's son, it is not the case.  Social History History   Social History  . Marital Status: Married    Spouse Name: N/A    Number of Children: N/A  . Years of Education: N/A   Occupational History  . Not on file.   Social History Main Topics  . Smoking status: Never Smoker   . Smokeless tobacco: Never Used  . Alcohol Use: No  . Drug Use: No  . Sexual Activity: Not on file   Other Topics Concern  . Not on file   Social History Narrative  . No narrative on file     Review of Systems  General:  No chills, fever, night sweats or weight changes.  Cardiovascular:  No edema, orthopnea, palpitations, paroxysmal nocturnal dyspnea. Occasional CP, mild baseline DOE Dermatological: No rash, lesions/masses Respiratory: No cough, dyspnea Urologic: No hematuria, dysuria Abdominal:   No nausea, vomiting, diarrhea, bright red blood per rectum, melena, or hematemesis Neurologic:  No visual changes, wkns, changes in mental status. Some chronic dizziness, no presyncope All other systems reviewed and are otherwise negative except as noted above.  Physical Exam  Blood pressure 141/78, pulse 95, temperature 97.9 F (36.6 C), temperature source Oral, resp. rate 18, height $RemoveBe'5\' 3"'BMieSRPsO$  (1.6 m), weight 93 lb (42.185 kg), SpO2 96.00%.  General: Pleasant, NAD Psych: Normal affect. Neuro: Alert pleasant. Moves all extremities spontaneously. HEENT: Normal  Neck: Supple without bruits. +JVD. Lungs:  Resp regular and unlabored, bibasilar rale, worse on R base Heart: RRR no s3, s4. 3/6 systolic murmur, late peaking. Abdomen: Soft, non-tender, BS + x 4. Distended, nontender Extremities: No clubbing, cyanosis or edema.  DP/PT/Radials 2+ and equal bilaterally.  Labs   Lab Results  Component Value Date   WBC 8.3 09/10/2014   HGB 11.4* 09/10/2014   HCT 36.1 09/10/2014   MCV 92.6 09/10/2014   PLT 288 09/10/2014     Recent Labs Lab 09/10/14 1326  NA 142  K 3.8  CL 104  CO2 24  BUN 14  CREATININE 0.49*  CALCIUM 9.3  GLUCOSE 156*    Radiology/Studies  Dg Chest 2 View  09/10/2014   CLINICAL DATA:  For right hip arthroplasty, history of heart murmur  EXAM: CHEST  2 VIEW  COMPARISON:  Chest x-ray of 10/24/2013  FINDINGS: Linear opacities are noted both in lung bases which may reflect linear atelectasis or possibly scarring. No definite pneumonia or effusion is seen. The lungs are slightly hyperaerated. No mediastinal or hilar adenopathy is seen. The heart is mildly enlarged. The descending thoracic aorta is ectatic. The bones are diffusely osteopenic and multiple thoracic and lumbar compression deformities are noted which appear new compared to the lateral chest x-ray from December of 2014.  IMPRESSION: 1. Bibasilar linear atelectasis or scarring. 2. Osteopenia and multiple thoracic  and lumbar vertebral compression deformities, new since December of 2014   Electronically Signed   By: Ivar Drape M.D.   On: 09/10/2014 13:39    ECG  No new EKG since 2014  ASSESSMENT AND PLAN  1. Preoperative clearance for R intertrochanteric fracture  - pending echo and EKG  - high risk patient for orthopaedic procedure esp with anesthesia and her multiple comorbidities and severe aortic stenosis, however severe aortic stenosis appears to be asymptomatic (symptomatic AS would have placed her at an even higher risk). She may proceed with surgery with close hemodynamic monitoring. Had long discussion with her and son and without surgery, she will not be able to walk again and likelihood of mortality in the next 6 months is very high. She does not want this from a quality of life perspective and her son understands  increased risk with surgery.  We will follow the patient's perioperative course.   2. H/o severe aortic stenosis  - Echo 04/29/2013 showed EF 94-58%, grade 1 diastolic dysfunction, severe aortic stenosis, PA peak pressure 33  - ?if occasional chest pain is related to this, however only occur on rare occasion with no obvious exertional component. Stress test at this point likely would not change her treatment course as she need this surgery. She is not a surgical aortic stenosis replacement candidate.   3. Hyperlipidemia 4. Autoimmune hemolytic anemia, on chronic prednisone 5. Low-grade myelodysplastic syndrome 6. Dementia 7. Osteoporosis: likely contribute to fx, not on calcium or Vit D while on chronic prednisone   Signed, Almyra Deforest, PA-C 09/13/2014, 3:15 PM  Personally seen and examined. Agree with above. Changes to note were made.  Candee Furbish, MD

## 2014-09-13 NOTE — H&P (Addendum)
[facility] History and Physical                                                                                    Patient Demographics  Nicole Harvey, is a 80 y.o. female  MRN: [id number]   DOB - January 07, 1928  Admit Date - 09/13/2014  Outpatient Primary MD for the patient is Leonard Downing, MD   With History of -  Past Medical History  Diagnosis Date  . Anemia   . PONV (postoperative nausea and vomiting)   . 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 Family history of anesthesia complication     "son did; don't remember what reaction was though" (10/25/2013)  . High cholesterol   . Heart murmur   . Pneumonia     "when I was a little girl" (10/25/2013)  . History of blood transfusion     "quite a few; related to Myelodysplastic" (10/25/2013)  . Dementia     "don't know kind or stage" (10/25/2013)  . Arthritis     "hands" (10/25/2013)  . Kidney stones   . Myelodysplastic syndrome, low grade 10/09/2011  . Skin cancer of face     "several cut off face" (10/25/2013)      Past Surgical History  Procedure Laterality Date  . Kidney stone surgery  ?1970's  . Tonsillectomy    . Vaginal hysterectomy      in for   No chief complaint on file.    HPI  Nicole Harvey  is a 80 y.o. female, sent as a direct admission by Dr. Mardelle Matte.  Pt seen for progressive weakness of right leg and decrease in ambulation.  No recollection of fall or trauma to the right leg.  Last known fall was approximately six months ago.  Pt's son who is the primary caregiver notes that the pt has dementia.  The son states that the pt has had a decrease in ambulation over the last three weeks and began requiring the use of a cane and walker to move.  Pt has been unable to walk during the last two weeks.  Denies any leg or hip pain but the pt has complained of intermittent back pain over the last three weeks.  Pt's dementia affects her ability to describe the type of pain she has been  experiencing in her back.  No ecchymosis was noted over the last three weeks.    Review of Systems   Unable to obtain an accurate ROS due to pt hx of dementia.     Social History History  Substance Use Topics  . Smoking status: Never Smoker   . Smokeless tobacco: Never Used  . Alcohol Use: No     Family History No family history on file.   Prior to Admission medications   Medication Sig Start Date End Date Taking? Authorizing Provider  folic acid (FOLVITE) 1 MG tablet Take 1 mg by mouth daily.    Historical Provider, MD  pantoprazole (PROTONIX) 40 MG tablet Take 40 mg by mouth daily.    Historical Provider, MD  predniSONE (DELTASONE) 20 MG tablet Take 10 mg by mouth daily with breakfast.    Historical Provider,  MD  traMADol (ULTRAM) 50 MG tablet Take 50-100 mg by mouth every 4 (four) hours as needed (pain).    Historical Provider, MD    Allergies  Allergen Reactions  . Other Shortness Of Breath and Itching    Med for ???? Instructed patient to bring.  . Codeine Nausea And Vomiting  . Epinephrine Other (See Comments)    Unknown  . Sulfa Antibiotics Other (See Comments)    Unknown    Physical Exam  Vitals  Blood pressure 141/78, pulse 95, temperature 97.9 F (36.6 C), temperature source Oral, resp. rate 18, height 5\' 3"  (1.6 m), weight 42.185 kg (93 lb), SpO2 96.00%.   General:  lying in bed in NAD,   Psych:  Awake Alert, Demented   Neuro:   No F.N deficits, ALL C.Nerves Intact, Sensation intact all 4 extremities.  ENT:  Ears and Eyes appear Normal, Conjunctivae clear. Moist Oral Mucosa.  Neck:  Supple Neck, No JVD  Respiratory:  Symmetrical Chest wall movement, Good air movement bilaterally, CTAB.  Cardiac:  RRR, No Gallops, Rubs, holosystolic murmur noted in 2 LICS and RICS  Abdomen:  Positive Bowel Sounds, Abdomen Soft, Non tender, No organomegaly appriciated  Skin:  No Cyanosis, Normal Skin Turgor, No Skin Rash or Bruise.  Extremities:  Atrophic  changes in the right thigh, joints appear normal , no effusions. Normal ROM in left leg.  Pt unable to flex, abduct, or adduct right hip.  Normal ROM of right knee and ankle.       Data Review  CBC  Recent Labs Lab 09/10/14 1326  WBC 8.3  HGB 11.4*  HCT 36.1  PLT 288  MCV 92.6  MCH 29.2  MCHC 31.6  RDW 15.0   ------------------------------------------------------------------------------------------------------------------  Chemistries   Recent Labs Lab 09/10/14 1326  NA 142  K 3.8  CL 104  CO2 24  GLUCOSE 156*  BUN 14  CREATININE 0.49*  CALCIUM 9.3   ------------------------------------------------------------------------------------------------------------------ estimated creatinine clearance is 34.9 ml/min (by C-G formula based on Cr of 0.49). ------------------------------------------------------------------------------------------------------------------  Imaging results:   Dg Chest 2 View  09/10/2014   CLINICAL DATA:  For right hip arthroplasty, history of heart murmur  EXAM: CHEST  2 VIEW  COMPARISON:  Chest x-ray of 10/24/2013  FINDINGS: Linear opacities are noted both in lung bases which may reflect linear atelectasis or possibly scarring. No definite pneumonia or effusion is seen. The lungs are slightly hyperaerated. No mediastinal or hilar adenopathy is seen. The heart is mildly enlarged. The descending thoracic aorta is ectatic. The bones are diffusely osteopenic and multiple thoracic and lumbar compression deformities are noted which appear new compared to the lateral chest x-ray from December of 2014.  IMPRESSION: 1. Bibasilar linear atelectasis or scarring. 2. Osteopenia and multiple thoracic and lumbar vertebral compression deformities, new since December of 2014   Electronically Signed   By: Ivar Drape M.D.   On: 09/10/2014 13:39    Assessment & Plan Intertrochanteric fracture of right hip: Hx of osteoporosis- untreated.  Dementia- no memory of fall.   Ortho to perform surgery on 10/30.  Pt to remain NPO.  Ancef 2 g to be administered prior to surgery.   Pre-operative clearance: High risk due to dementia and age and severe aortic stenosis.  Will obtain transthoracic echocardiogram, EKG, CMP, CBC, TSH, UA, Troponin I, and cardiology consultation.   Aortic stenosis: Patient having last transthoracic echocardiogram performed on 04/29/2013 which showed severe aortic stenosis having valvular area of 0.62 cm she  does not appear to have evidence of acute decompensated congestive heart failure. She would likely not be candidate for uric valve replacement however will consult cardiology to receive their input. Repeat transthoracic echocardiogram has been ordered.  Myelodysplastic syndrome, low grade: Chronic problem.  Stable.  Dementia with behavioral disturbance: Chronic problem- no medications.  Stable- pt is unable to recall recent events.  Principal Problem:   Intertrochanteric fracture of right hip Active Problems:   Myelodysplastic syndrome, low grade   Dementia with behavioral disturbance   Hip fracture   DVT Prophylaxis Heparin 5,000 units q8h AM Labs Ordered, also please review Full Orders  Family Communication:      Code Status: Full  Likely DC to SNF   Condition:  Stable  Time spent in minutes : 60    Adelene Idler I PA-S on 09/13/2014 at 2:08 PM  Between 7am to 7pm - Pager -  After 7pm go to [email or web address] - password [email or web address]  And look for the night coverage person covering me after hours  [facility] Office  [phone]   Addendum  I personally saw and evaluated patient on 09/13/2014. Patient is a pleasant 80 year old female with a past medical history of advanced dementia who currently resides with her son. History was limited given advanced dementia, history obtained from her son who was present at bedside. Over the past several weeks she has had persistent bilateral lower extremity weakness and back  pain which has been progressive and now within the last week she has been unable to ambulate. She went to Dr Sharion Settler office where imaging studies revealed the presence of a right intertrochanteric hip fracture. She does not recall falling nor does to her son report witnessing any falls in the past month. Her last transthoracic echocardiogram was performed on 04/29/2013 which showed an ejection fraction 55-60%, with severe aortic stenosis having valvular area of 0.62 cm. On physical examination she did not appear to have clinical signs or symptoms suggestive of acute CHF. Had at least a 3/6 systolic ejection murmur, with radiation to carotids. Her right lower extremity was externally rotated, as she complained of some pain with hip flexion. Otherwise she did not appear to be in acute distress. Given patient's history of severe aortic stenosis, advanced age, advanced dementia, I believe this likely would make her a high surgical risk. Cardiology consultation has been placed. Will obtain transthoracic echocardiogram, await further input from cardiology.

## 2014-09-14 ENCOUNTER — Encounter (HOSPITAL_COMMUNITY): Payer: Self-pay | Admitting: Certified Registered Nurse Anesthetist

## 2014-09-14 ENCOUNTER — Encounter (HOSPITAL_COMMUNITY): Admission: RE | Payer: Self-pay | Source: Ambulatory Visit

## 2014-09-14 ENCOUNTER — Inpatient Hospital Stay (HOSPITAL_COMMUNITY): Payer: Medicare Other

## 2014-09-14 ENCOUNTER — Inpatient Hospital Stay (HOSPITAL_COMMUNITY): Payer: Medicare Other | Admitting: Anesthesiology

## 2014-09-14 ENCOUNTER — Encounter (HOSPITAL_COMMUNITY): Payer: Medicare Other | Admitting: Anesthesiology

## 2014-09-14 ENCOUNTER — Encounter (HOSPITAL_COMMUNITY)
Admission: AD | Disposition: A | Discharge status: Skilled Nursing Facility | Payer: Self-pay | Source: Ambulatory Visit | Attending: Internal Medicine

## 2014-09-14 ENCOUNTER — Inpatient Hospital Stay (HOSPITAL_COMMUNITY): Admission: RE | Admit: 2014-09-14 | Payer: Medicare Other | Source: Ambulatory Visit | Admitting: Orthopedic Surgery

## 2014-09-14 ENCOUNTER — Ambulatory Visit: Payer: Medicare Other | Admitting: Internal Medicine

## 2014-09-14 DIAGNOSIS — E43 Unspecified severe protein-calorie malnutrition: Secondary | ICD-10-CM | POA: Insufficient documentation

## 2014-09-14 DIAGNOSIS — I359 Nonrheumatic aortic valve disorder, unspecified: Secondary | ICD-10-CM

## 2014-09-14 HISTORY — PX: HIP ARTHROPLASTY: SHX981

## 2014-09-14 LAB — SURGICAL PCR SCREEN
MRSA, PCR: NEGATIVE
Staphylococcus aureus: NEGATIVE

## 2014-09-14 LAB — CBC
HCT: 36.3 % (ref 36.0–46.0)
Hemoglobin: 11.7 g/dL — ABNORMAL LOW (ref 12.0–15.0)
MCH: 29.4 pg (ref 26.0–34.0)
MCHC: 32.2 g/dL (ref 30.0–36.0)
MCV: 91.2 fL (ref 78.0–100.0)
Platelets: 267 10*3/uL (ref 150–400)
RBC: 3.98 MIL/uL (ref 3.87–5.11)
RDW: 15 % (ref 11.5–15.5)
WBC: 6.6 10*3/uL (ref 4.0–10.5)

## 2014-09-14 LAB — BASIC METABOLIC PANEL
Anion gap: 12 (ref 5–15)
BUN: 15 mg/dL (ref 6–23)
CALCIUM: 9.2 mg/dL (ref 8.4–10.5)
CO2: 27 mEq/L (ref 19–32)
Chloride: 104 mEq/L (ref 96–112)
Creatinine, Ser: 0.39 mg/dL — ABNORMAL LOW (ref 0.50–1.10)
GFR calc Af Amer: >90 mL/min (ref >90)
GFR calc non Af Amer: >90 mL/min (ref >90)
GLUCOSE: 92 mg/dL (ref 70–99)
Potassium: 3.5 mEq/L — ABNORMAL LOW (ref 3.7–5.3)
Sodium: 143 mEq/L (ref 137–147)

## 2014-09-14 LAB — MRSA PCR SCREENING: MRSA by PCR: NEGATIVE

## 2014-09-14 SURGERY — HEMIARTHROPLASTY, HIP, DIRECT ANTERIOR APPROACH, FOR FRACTURE
Anesthesia: General | Laterality: Right

## 2014-09-14 SURGERY — HEMIARTHROPLASTY, HIP, DIRECT ANTERIOR APPROACH, FOR FRACTURE
Anesthesia: General | Site: Hip | Laterality: Right

## 2014-09-14 MED ORDER — FENTANYL CITRATE 0.05 MG/ML IJ SOLN
25.0000 ug | INTRAMUSCULAR | Status: DC | PRN
  Administered 2014-09-14 (×3): 50 ug via INTRAVENOUS

## 2014-09-14 MED ORDER — BUPIVACAINE HCL (PF) 0.25 % IJ SOLN
INTRAMUSCULAR | Status: DC | PRN
  Administered 2014-09-14: 10 mL

## 2014-09-14 MED ORDER — ROCURONIUM BROMIDE 50 MG/5ML IV SOLN
INTRAVENOUS | Status: AC
  Filled 2014-09-14: qty 1

## 2014-09-14 MED ORDER — ONDANSETRON HCL 4 MG/2ML IJ SOLN
INTRAMUSCULAR | Status: AC
  Filled 2014-09-14: qty 2

## 2014-09-14 MED ORDER — ENOXAPARIN SODIUM 30 MG/0.3ML ~~LOC~~ SOLN
30.0000 mg | SUBCUTANEOUS | Status: DC
  Administered 2014-09-15 – 2014-09-18 (×4): 30 mg via SUBCUTANEOUS
  Filled 2014-09-14 (×6): qty 0.3

## 2014-09-14 MED ORDER — PHENYLEPHRINE HCL 10 MG/ML IJ SOLN
10.0000 mg | INTRAMUSCULAR | Status: DC | PRN
  Administered 2014-09-14: 20 ug/min via INTRAVENOUS

## 2014-09-14 MED ORDER — SENNA-DOCUSATE SODIUM 8.6-50 MG PO TABS: 2.0000 | ORAL_TABLET | Freq: Every day | ORAL | Status: AC

## 2014-09-14 MED ORDER — CEFAZOLIN SODIUM-DEXTROSE 2-3 GM-% IV SOLR
2.0000 g | Freq: Four times a day (QID) | INTRAVENOUS | Status: AC
  Administered 2014-09-14 – 2014-09-15 (×2): 2 g via INTRAVENOUS
  Filled 2014-09-14 (×3): qty 50

## 2014-09-14 MED ORDER — BUPIVACAINE HCL (PF) 0.25 % IJ SOLN
INTRAMUSCULAR | Status: AC
  Filled 2014-09-14: qty 30

## 2014-09-14 MED ORDER — ETOMIDATE 2 MG/ML IV SOLN
INTRAVENOUS | Status: DC | PRN
  Administered 2014-09-14: 12 mg via INTRAVENOUS

## 2014-09-14 MED ORDER — ARTIFICIAL TEARS OP OINT
TOPICAL_OINTMENT | OPHTHALMIC | Status: DC | PRN
  Administered 2014-09-14: 1 via OPHTHALMIC

## 2014-09-14 MED ORDER — ROCURONIUM BROMIDE 100 MG/10ML IV SOLN
INTRAVENOUS | Status: DC | PRN
  Administered 2014-09-14: 35 mg via INTRAVENOUS

## 2014-09-14 MED ORDER — ACETAMINOPHEN 325 MG PO TABS: 650.0000 mg | ORAL_TABLET | Freq: Four times a day (QID) | ORAL | Status: DC | PRN

## 2014-09-14 MED ORDER — DOCUSATE SODIUM 100 MG PO CAPS
100.0000 mg | ORAL_CAPSULE | Freq: Two times a day (BID) | ORAL | Status: DC
  Administered 2014-09-15: 100 mg via ORAL
  Filled 2014-09-14 (×2): qty 1

## 2014-09-14 MED ORDER — BISACODYL 10 MG RE SUPP: 10.0000 mg | Freq: Every day | RECTAL | Status: DC | PRN

## 2014-09-14 MED ORDER — MENTHOL 3 MG MT LOZG
1.0000 | LOZENGE | OROMUCOSAL | Status: DC | PRN
  Filled 2014-09-14: qty 9

## 2014-09-14 MED ORDER — LORAZEPAM 2 MG/ML IJ SOLN
0.5000 mg | Freq: Once | INTRAMUSCULAR | Status: AC
  Administered 2014-09-14: 0.5 mg via INTRAVENOUS
  Filled 2014-09-14: qty 1

## 2014-09-14 MED ORDER — ONDANSETRON HCL 4 MG PO TABS: 4.0000 mg | ORAL_TABLET | Freq: Four times a day (QID) | ORAL | Status: DC | PRN

## 2014-09-14 MED ORDER — FENTANYL CITRATE 0.05 MG/ML IJ SOLN
INTRAMUSCULAR | Status: AC
  Filled 2014-09-14: qty 2

## 2014-09-14 MED ORDER — NEOSTIGMINE METHYLSULFATE 10 MG/10ML IV SOLN
INTRAVENOUS | Status: DC | PRN
  Administered 2014-09-14: 3 mg via INTRAVENOUS

## 2014-09-14 MED ORDER — LACTATED RINGERS IV SOLN
INTRAVENOUS | Status: DC | PRN
  Administered 2014-09-14 (×2): via INTRAVENOUS

## 2014-09-14 MED ORDER — FENTANYL CITRATE 0.05 MG/ML IJ SOLN
INTRAMUSCULAR | Status: DC | PRN
Start: 2014-09-14 — End: 2014-09-14
  Administered 2014-09-14 (×8): 25 ug via INTRAVENOUS

## 2014-09-14 MED ORDER — SENNA 8.6 MG PO TABS
1.0000 | ORAL_TABLET | Freq: Two times a day (BID) | ORAL | Status: DC
  Filled 2014-09-14 (×3): qty 1

## 2014-09-14 MED ORDER — DEXAMETHASONE SODIUM PHOSPHATE 4 MG/ML IJ SOLN
INTRAMUSCULAR | Status: DC | PRN
  Administered 2014-09-14: 4 mg via INTRAVENOUS

## 2014-09-14 MED ORDER — ONDANSETRON HCL 4 MG/2ML IJ SOLN: 4.0000 mg | Freq: Once | INTRAMUSCULAR | Status: DC | PRN

## 2014-09-14 MED ORDER — SODIUM CHLORIDE 0.9 % IR SOLN
Status: DC | PRN
  Administered 2014-09-14: 3000 mL

## 2014-09-14 MED ORDER — PROPOFOL 10 MG/ML IV BOLUS
INTRAVENOUS | Status: DC | PRN
  Administered 2014-09-14: 30 mg via INTRAVENOUS

## 2014-09-14 MED ORDER — POTASSIUM CHLORIDE IN NACL 20-0.45 MEQ/L-% IV SOLN
INTRAVENOUS | Status: DC
  Administered 2014-09-14: 1000 mL via INTRAVENOUS
  Filled 2014-09-14 (×3): qty 1000

## 2014-09-14 MED ORDER — LIDOCAINE HCL (CARDIAC) 20 MG/ML IV SOLN
INTRAVENOUS | Status: DC | PRN
  Administered 2014-09-14: 60 mg via INTRAVENOUS

## 2014-09-14 MED ORDER — LIDOCAINE HCL (CARDIAC) 20 MG/ML IV SOLN
INTRAVENOUS | Status: AC
  Filled 2014-09-14: qty 5

## 2014-09-14 MED ORDER — POTASSIUM CHLORIDE IN NACL 40-0.9 MEQ/L-% IV SOLN
INTRAVENOUS | Status: DC
  Administered 2014-09-14: 50 mL/h via INTRAVENOUS
  Filled 2014-09-14: qty 1000

## 2014-09-14 MED ORDER — PHENOL 1.4 % MT LIQD
1.0000 | OROMUCOSAL | Status: DC | PRN
Start: 2014-09-14 — End: 2014-09-15
  Filled 2014-09-14: qty 177

## 2014-09-14 MED ORDER — NEOSTIGMINE METHYLSULFATE 10 MG/10ML IV SOLN
INTRAVENOUS | Status: AC
  Filled 2014-09-14: qty 2

## 2014-09-14 MED ORDER — NEOSTIGMINE METHYLSULFATE 10 MG/10ML IV SOLN
INTRAVENOUS | Status: AC
  Filled 2014-09-14: qty 1

## 2014-09-14 MED ORDER — ENOXAPARIN SODIUM 30 MG/0.3ML ~~LOC~~ SOLN: 30.0000 mg | SUBCUTANEOUS | Status: DC

## 2014-09-14 MED ORDER — MORPHINE SULFATE 2 MG/ML IJ SOLN
INTRAMUSCULAR | Status: AC
  Administered 2014-09-14: 2 mg via INTRAMUSCULAR
  Filled 2014-09-14: qty 1

## 2014-09-14 MED ORDER — FENTANYL CITRATE 0.05 MG/ML IJ SOLN
INTRAMUSCULAR | Status: AC
  Filled 2014-09-14: qty 5

## 2014-09-14 MED ORDER — STERILE WATER FOR IRRIGATION IR SOLN
Status: DC | PRN
  Administered 2014-09-14: 1000 mL

## 2014-09-14 MED ORDER — FLEET ENEMA 7-19 GM/118ML RE ENEM
1.0000 | ENEMA | Freq: Once | RECTAL | Status: AC | PRN
  Filled 2014-09-14: qty 1

## 2014-09-14 MED ORDER — LORAZEPAM 2 MG/ML IJ SOLN
0.5000 mg | Freq: Four times a day (QID) | INTRAMUSCULAR | Status: DC | PRN
  Administered 2014-09-14 – 2014-09-15 (×2): 0.5 mg via INTRAVENOUS
  Filled 2014-09-14 (×2): qty 1

## 2014-09-14 MED ORDER — CEFAZOLIN SODIUM-DEXTROSE 2-3 GM-% IV SOLR
INTRAVENOUS | Status: DC | PRN
  Administered 2014-09-14: 2 g via INTRAVENOUS

## 2014-09-14 MED ORDER — DEXAMETHASONE SODIUM PHOSPHATE 4 MG/ML IJ SOLN
INTRAMUSCULAR | Status: AC
  Filled 2014-09-14: qty 1

## 2014-09-14 MED ORDER — ACETAMINOPHEN 650 MG RE SUPP: 650.0000 mg | Freq: Four times a day (QID) | RECTAL | Status: DC | PRN

## 2014-09-14 MED ORDER — GLYCOPYRROLATE 0.2 MG/ML IJ SOLN
INTRAMUSCULAR | Status: AC
  Filled 2014-09-14: qty 2

## 2014-09-14 MED ORDER — POLYETHYLENE GLYCOL 3350 17 G PO PACK
17.0000 g | PACK | Freq: Every day | ORAL | Status: DC | PRN
  Filled 2014-09-14: qty 1

## 2014-09-14 MED ORDER — 0.9 % SODIUM CHLORIDE (POUR BTL) OPTIME
TOPICAL | Status: DC | PRN
  Administered 2014-09-14: 1000 mL

## 2014-09-14 MED ORDER — ONDANSETRON HCL 4 MG/2ML IJ SOLN
4.0000 mg | Freq: Four times a day (QID) | INTRAMUSCULAR | Status: DC | PRN
Start: 2014-09-14 — End: 2014-09-18

## 2014-09-14 MED ORDER — GLYCOPYRROLATE 0.2 MG/ML IJ SOLN
INTRAMUSCULAR | Status: DC | PRN
  Administered 2014-09-14: 0.4 mg via INTRAVENOUS

## 2014-09-14 MED ORDER — ALUM & MAG HYDROXIDE-SIMETH 200-200-20 MG/5ML PO SUSP: 30.0000 mL | ORAL | Status: DC | PRN

## 2014-09-14 MED ORDER — ONDANSETRON HCL 4 MG/2ML IJ SOLN
INTRAMUSCULAR | Status: DC | PRN
  Administered 2014-09-14: 4 mg via INTRAVENOUS

## 2014-09-14 SURGICAL SUPPLY — 68 items
APL SKNCLS STERI-STRIP NONHPOA (GAUZE/BANDAGES/DRESSINGS) ×1
BENZOIN TINCTURE PRP APPL 2/3 (GAUZE/BANDAGES/DRESSINGS) ×3 IMPLANT
BLADE SAW SGTL 18.5X63.X.64 HD (BLADE) ×3 IMPLANT
BRUSH FEMORAL CANAL (MISCELLANEOUS) IMPLANT
CAPT HIP FX BIPOLAR/UNIPOLAR ×2 IMPLANT
CEMENT BONE DEPUY (Cement) ×4 IMPLANT
CEMENT RESTRICTOR DEPUY SZ 4 (Cement) ×2 IMPLANT
CLOSURE STERI-STRIP 1/2X4 (GAUZE/BANDAGES/DRESSINGS) ×1
CLSR STERI-STRIP ANTIMIC 1/2X4 (GAUZE/BANDAGES/DRESSINGS) ×2 IMPLANT
COVER BACK TABLE 24X17X13 BIG (DRAPES) IMPLANT
COVER SURGICAL LIGHT HANDLE (MISCELLANEOUS) ×3 IMPLANT
DRAPE IMP U-DRAPE 54X76 (DRAPES) ×3 IMPLANT
DRAPE INCISE IOBAN 66X45 STRL (DRAPES) ×2 IMPLANT
DRAPE ORTHO SPLIT 77X108 STRL (DRAPES) ×6
DRAPE SURG ORHT 6 SPLT 77X108 (DRAPES) ×2 IMPLANT
DRAPE U-SHAPE 47X51 STRL (DRAPES) ×3 IMPLANT
DRILL BIT 5/64 (BIT) ×3 IMPLANT
DRSG MEPILEX BORDER 4X12 (GAUZE/BANDAGES/DRESSINGS) IMPLANT
DRSG MEPILEX BORDER 4X8 (GAUZE/BANDAGES/DRESSINGS) ×2 IMPLANT
DURAPREP 26ML APPLICATOR (WOUND CARE) ×3 IMPLANT
ELECT BLADE 6.5 EXT (BLADE) ×2 IMPLANT
ELECT CAUTERY BLADE 6.4 (BLADE) ×3 IMPLANT
ELECT REM PT RETURN 9FT ADLT (ELECTROSURGICAL) ×3
ELECTRODE REM PT RTRN 9FT ADLT (ELECTROSURGICAL) ×1 IMPLANT
FACESHIELD WRAPAROUND (MASK) ×6 IMPLANT
FACESHIELD WRAPAROUND OR TEAM (MASK) ×2 IMPLANT
GLOVE BIO SURGEON STRL SZ 6.5 (GLOVE) ×1 IMPLANT
GLOVE BIO SURGEONS STRL SZ 6.5 (GLOVE) ×1
GLOVE BIOGEL PI IND STRL 6.5 (GLOVE) IMPLANT
GLOVE BIOGEL PI INDICATOR 6.5 (GLOVE) ×2
GLOVE BIOGEL PI ORTHO PRO SZ8 (GLOVE) ×2
GLOVE ORTHO TXT STRL SZ7.5 (GLOVE) ×3 IMPLANT
GLOVE PI ORTHO PRO STRL SZ8 (GLOVE) ×1 IMPLANT
GLOVE SURG ORTHO 8.0 STRL STRW (GLOVE) ×6 IMPLANT
GOWN STRL REUS W/ TWL LRG LVL3 (GOWN DISPOSABLE) IMPLANT
GOWN STRL REUS W/ TWL XL LVL3 (GOWN DISPOSABLE) ×1 IMPLANT
GOWN STRL REUS W/TWL 2XL LVL3 (GOWN DISPOSABLE) ×3 IMPLANT
GOWN STRL REUS W/TWL LRG LVL3 (GOWN DISPOSABLE) ×3
GOWN STRL REUS W/TWL XL LVL3 (GOWN DISPOSABLE) ×3
HANDPIECE INTERPULSE COAX TIP (DISPOSABLE)
KIT BASIN OR (CUSTOM PROCEDURE TRAY) ×3 IMPLANT
KIT ROOM TURNOVER OR (KITS) ×3 IMPLANT
MANIFOLD NEPTUNE II (INSTRUMENTS) ×3 IMPLANT
NDL SUT 6 .5 CRC .975X.05 MAYO (NEEDLE) IMPLANT
NEEDLE 22X1 1/2 (OR ONLY) (NEEDLE) ×3 IMPLANT
NEEDLE MAYO TAPER (NEEDLE)
NS IRRIG 1000ML POUR BTL (IV SOLUTION) ×3 IMPLANT
PACK TOTAL JOINT (CUSTOM PROCEDURE TRAY) ×3 IMPLANT
PACK UNIVERSAL I (CUSTOM PROCEDURE TRAY) ×3 IMPLANT
PAD ARMBOARD 7.5X6 YLW CONV (MISCELLANEOUS) ×6 IMPLANT
PILLOW ABDUCTION HIP (SOFTGOODS) ×3 IMPLANT
PRESSURIZER FEMORAL UNIV (MISCELLANEOUS) IMPLANT
RETRIEVER SUT HEWSON (MISCELLANEOUS) ×3 IMPLANT
SET HNDPC FAN SPRY TIP SCT (DISPOSABLE) IMPLANT
SUT FIBERWIRE #2 38 REV NDL BL (SUTURE) ×9
SUT MNCRL AB 4-0 PS2 18 (SUTURE) ×3 IMPLANT
SUT VIC AB 0 CT1 27 (SUTURE) ×3
SUT VIC AB 0 CT1 27XBRD ANBCTR (SUTURE) ×1 IMPLANT
SUT VIC AB 1 CT1 27 (SUTURE) ×6
SUT VIC AB 1 CT1 27XBRD ANBCTR (SUTURE) ×2 IMPLANT
SUT VIC AB 3-0 SH 8-18 (SUTURE) ×3 IMPLANT
SUTURE FIBERWR#2 38 REV NDL BL (SUTURE) ×2 IMPLANT
SYR CONTROL 10ML LL (SYRINGE) ×3 IMPLANT
TOWEL OR 17X24 6PK STRL BLUE (TOWEL DISPOSABLE) ×3 IMPLANT
TOWEL OR 17X26 10 PK STRL BLUE (TOWEL DISPOSABLE) ×3 IMPLANT
TOWER CARTRIDGE SMART MIX (DISPOSABLE) IMPLANT
TRAY FOLEY CATH 16FRSI W/METER (SET/KITS/TRAYS/PACK) ×3 IMPLANT
WATER STERILE IRR 1000ML POUR (IV SOLUTION) ×4 IMPLANT

## 2014-09-14 NOTE — Progress Notes (Signed)
Patient Demographics  Nicole Harvey, is a 80 y.o. female, DOB - 1927-12-26, PXT:[id number]  Admit date - 09/13/2014   Admitting Physician Kelvin Cellar, MD  Outpatient Primary MD for the patient is Leonard Downing, MD  LOS - 1   No chief complaint on file.       Subjective:   Nicole Harvey today has, No headache, No chest pain, No abdominal pain - No Nausea, No new weakness tingling or numbness, No Cough - SOB. Complains of Foley irritating her, But is unreliable historian.  Assessment & Plan    1. Intertrochanteric fracture of right hip - diagnosed by Dr. Mardelle Matte in the office where she presented with ongoing right hip discomfort, scheduled for open reduction internal fixation by Dr. Mardelle Matte on 09/14/2014. Seen by cardiology. Due to her severe aortic stenosis she is a high-risk candidate for adverse cardio pulmonary outcome during perioperative period. Kindly see cardiology consult note.   2. Severe aortic stenosis. Valvular area 0.62 cm on echogram in May 2014. Strict intake and output monitor, avoid ACE/ARB or preload reduction, appreciate cardiology input.   3. Myelodysplastic syndrome. Chronic. Outpatient monitor with PCP. Chronically on prednisone. Monitor the need for stress dose steroids.   4. Advanced dementia. At risk for severe delirium. Supportive care. May require placement.    5. GERD. On PPI.     Code Status: Full  Family Communication: None present  Disposition Plan: To be decided likely SNF   Procedures right ORIF scheduled on 09/14/2014 by Dr. Mardelle Matte   Consults cardiology, orthopedics   Medications  Scheduled Meds: . docusate sodium  100 mg Oral BID  . heparin  5,000 Units Subcutaneous 3 times per day  . pantoprazole  40 mg Oral Daily  .  predniSONE  10 mg Oral Q breakfast  . sodium chloride  3 mL Intravenous Q12H  . sodium chloride  3 mL Intravenous Q12H   Continuous Infusions: . 0.9 % NaCl with KCl 40 mEq / L     PRN Meds:.sodium chloride, acetaminophen, acetaminophen, alum & mag hydroxide-simeth, morphine injection, ondansetron (ZOFRAN) IV, ondansetron, oxyCODONE, sodium chloride  DVT Prophylaxis   - Heparin    Lab Results  Component Value Date   PLT 267 09/14/2014    Antibiotics     Anti-infectives   Start     Dose/Rate Route Frequency Ordered Stop   09/13/14 1114  ceFAZolin (ANCEF) IVPB 2 g/50 mL premix     2 g 100 mL/hr over 30 Minutes Intravenous On call to O.R. 09/13/14 1114 09/14/14 0559          Objective:   Filed Vitals:   09/13/14 1111 09/13/14 2100 09/14/14 0630  BP: 141/78 125/74 128/68  Pulse: 95 87 96  Temp: 97.9 F (36.6 C) 97.6 F (36.4 C) 98.2 F (36.8 C)  TempSrc: Oral Oral Oral  Resp: 18 18 16   Height: 5\' 3"  (1.6 m)    Weight: 42.185 kg (93 lb)    SpO2: 96% 95% 99%    Wt Readings from Last 3 Encounters:  09/13/14 42.185 kg (93 lb)  09/13/14 42.185 kg (93 lb)  09/10/14 42.185 kg (93 lb)     Intake/Output Summary (Last 24 hours) at 09/14/14 0911 Last data filed at 09/14/14  0630  Gross per 24 hour  Intake      0 ml  Output   1100 ml  Net  -1100 ml     Physical Exam  Awake , confused, Oriented X 0, No new F.N deficits, Normal affect [place].AT,PERRAL Supple Neck,No JVD, No cervical lymphadenopathy appriciated.  Symmetrical Chest wall movement, Good air movement bilaterally, CTAB RRR,No Gallops,Rubs or new Murmurs, No Parasternal Heave +ve B.Sounds, Abd Soft, No tenderness, No organomegaly appriciated, No rebound - guarding or rigidity. Foley in place. No Cyanosis, Clubbing or edema, No new Rash or bruise      Data Review   Micro Results Recent Results (from the past 240 hour(s))  SURGICAL PCR SCREEN     Status: None   Collection Time    09/10/14  1:26 PM       Result Value Ref Range Status   MRSA, PCR NEGATIVE  NEGATIVE Final   Staphylococcus aureus NEGATIVE  NEGATIVE Final   Comment:            The Xpert SA Assay (FDA     approved for NASAL specimens     in patients over 90 years of age),     is one component of     a comprehensive surveillance     program.  Test performance has     been validated by [employer] for patients greater     than or equal to 62 year old.     It is not intended     to diagnose infection nor to     guide or monitor treatment.  SURGICAL PCR SCREEN     Status: None   Collection Time    09/14/14 12:35 AM      Result Value Ref Range Status   MRSA, PCR NEGATIVE  NEGATIVE Final   Staphylococcus aureus NEGATIVE  NEGATIVE Final   Comment:            The Xpert SA Assay (FDA     approved for NASAL specimens     in patients over 48 years of age),     is one component of     a comprehensive surveillance     program.  Test performance has     been validated by [employer] for patients greater     than or equal to 1 year old.     It is not intended     to diagnose infection nor to     guide or monitor treatment.    Radiology Reports Dg Chest 2 View  09/10/2014   CLINICAL DATA:  For right hip arthroplasty, history of heart murmur  EXAM: CHEST  2 VIEW  COMPARISON:  Chest x-ray of 10/24/2013  FINDINGS: Linear opacities are noted both in lung bases which may reflect linear atelectasis or possibly scarring. No definite pneumonia or effusion is seen. The lungs are slightly hyperaerated. No mediastinal or hilar adenopathy is seen. The heart is mildly enlarged. The descending thoracic aorta is ectatic. The bones are diffusely osteopenic and multiple thoracic and lumbar compression deformities are noted which appear new compared to the lateral chest x-ray from December of 2014.  IMPRESSION: 1. Bibasilar linear atelectasis or scarring. 2. Osteopenia and multiple thoracic and lumbar vertebral compression deformities, new  since December of 2014   Electronically Signed   By: Ivar Drape M.D.   On: 09/10/2014 13:39     CBC  Recent Labs Lab  09/10/14 1326 09/13/14 1515 09/14/14 0631  WBC 8.3 6.5 6.6  HGB 11.4* 11.6* 11.7*  HCT 36.1 35.6* 36.3  PLT 288 275 267  MCV 92.6 89.9 91.2  MCH 29.2 29.3 29.4  MCHC 31.6 32.6 32.2  RDW 15.0 15.1 15.0  LYMPHSABS  --  0.8  --   MONOABS  --  0.6  --   EOSABS  --  0.1  --   BASOSABS  --  0.1  --     Chemistries   Recent Labs Lab 09/10/14 1326 09/13/14 1515 09/14/14 0631  NA 142 144 143  K 3.8 3.5* 3.5*  CL 104 104 104  CO2 24 27 27   GLUCOSE 156* 134* 92  BUN 14 12 15   CREATININE 0.49* 0.49* 0.39*  CALCIUM 9.3 9.3 9.2  AST  --  15  --   ALT  --  18  --   ALKPHOS  --  337*  --   BILITOT  --  0.4  --    ------------------------------------------------------------------------------------------------------------------ estimated creatinine clearance is 34.9 ml/min (by C-G formula based on Cr of 0.39). ------------------------------------------------------------------------------------------------------------------ No results found for this basename: HGBA1C,  in the last 72 hours ------------------------------------------------------------------------------------------------------------------ No results found for this basename: CHOL, HDL, LDLCALC, TRIG, CHOLHDL, LDLDIRECT,  in the last 72 hours ------------------------------------------------------------------------------------------------------------------ No results found for this basename: TSH, T4TOTAL, FREET3, T3FREE, THYROIDAB,  in the last 72 hours ------------------------------------------------------------------------------------------------------------------ No results found for this basename: VITAMINB12, FOLATE, FERRITIN, TIBC, IRON, RETICCTPCT,  in the last 72 hours  Coagulation profile  Recent Labs Lab 09/13/14 1515  INR 1.14    No results found for this basename: DDIMER,  in the last  72 hours  Cardiac Enzymes  Recent Labs Lab 09/13/14 1515  TROPONINI <0.30   ------------------------------------------------------------------------------------------------------------------ No components found with this basename: POCBNP,      Time Spent in minutes   35   SINGH,PRASHANT K M.D on 09/14/2014 at 9:11 AM  Between 7am to 7pm - Pager - [phone]  After 7pm go to [email or web address] - password [email or web address]  And look for the night coverage person covering for me after hours  [facility] Office  [phone]

## 2014-09-14 NOTE — Op Note (Signed)
09/13/2014 - 09/14/2014  4:14 PM  PATIENT:  Nicole Harvey   MRN: [id number]  PRE-OPERATIVE DIAGNOSIS:  Subacute right displaced femoral neck fracture  POST-OPERATIVE DIAGNOSIS:  Same  PROCEDURE:  Procedure(s): RIGHT HIP BIPOLAR ARTHROPLASTY   PREOPERATIVE INDICATIONS:  Nicole Harvey is an 80 y.o. female who presented initially to my office with complaints of inability to walk. She was diagnosed with a femoral neck fracture, and ultimately brought into the hospital for preoperative optimization workup. The family elected for her to undergo hemiarthroplasty, despite severe aortic stenosis, failure to thrive, and advanced dementia.The risks benefits and alternatives were discussed with the patient including but not limited to the risks of nonoperative treatment, versus surgical intervention including infection, bleeding, nerve injury, periprosthetic fracture, the need for revision surgery, dislocation, leg length discrepancy, blood clots, cardiopulmonary complications, morbidity, mortality, among others, and they were willing to proceed.  Predicted outcome is good, although there will be at least a six to nine month expected recovery.   OPERATIVE REPORT     SURGEON:  Marchia Bond, MD    ASSISTANT:  Joya Gaskins, OPA-C  (Present throughout the entire procedure,  necessary for completion of procedure in a timely manner, assisting with retraction, instrumentation, and closure)     ANESTHESIA:  General    COMPLICATIONS:  None.      COMPONENTS:  Depuy [place] Basic Femoral Fracture stem size 6, with a stem centralizer, a cement restrictor, with a 0 spacer and a size 46 fracture head unipolar hip ball, and a total of 2 bags of Depuy CMW 1 Bone Cement.    PROCEDURE IN DETAIL: The patient was met in the holding area and identified.  The appropriate hip  was marked at the operative site. The patient was then transported to the OR and  placed under general anesthesia.  At that point, the  patient was  placed in the lateral decubitus position with the operative side up and  secured to the operating room table and all bony prominences padded.     The operative lower extremity was prepped from the iliac crest to the toes.  Sterile draping was performed.  Time out was performed prior to incision.      A routine posterolateral approach was utilized via sharp dissection  carried down to the subcutaneous tissue.  Gross bleeders were Bovie  coagulated.  The iliotibial band was identified and incised  along the length of the skin incision.  Self-retaining retractors were  inserted.  With the hip internally rotated, the short external rotators  were identified. The piriformis was tagged with FiberWire, and the hip capsule released in a T-type fashion.  The femoral neck was exposed, and I resected the femoral neck using the appropriate jig. This was performed at approximately a thumb's breadth above the lesser trochanter.    I then exposed the deep acetabulum, cleared out any tissue including the ligamentum teres, and included the hip capsule in the FiberWire used above and below the T.    I then prepared the proximal femur using the cookie-cutter, the lateralizing reamer, and then sequentially broached.  A trial utilized, and I reduced the hip and it was found to have excellent stability with functional range of motion. The trial components were then removed.   I then placed a cement restrictor, and cemented the real components in place. All excess cement was removed. Once the cement had cured, I impacted the real head ball into place. The hip was then reduced and  taken through functional range of motion and found to have excellent stability. Leg lengths were restored. Bone quality was extremely poor.  I then used a 2 mm drill bits to pass the FiberWire suture from the capsule and puriform is through the greater trochanter, and secured this. Excellent posterior capsular repair was achieved. I  also closed the T in the capsule.  I then irrigated the hip copiously again with pulse lavage, and repaired the fascia with Vicryl, followed by Vicryl for the subcutaneous tissue, Monocryl for the skin, Steri-Strips and sterile gauze. The wounds were injected. The patient was then awakened and returned to PACU in stable and satisfactory condition. There were no complications.  Marchia Bond, MD Orthopedic Surgeon [phone]   09/14/2014 4:14 PM

## 2014-09-14 NOTE — Anesthesia Procedure Notes (Signed)
Procedure Name: Intubation Date/Time: 09/14/2014 2:12 PM Performed by: Raphael Gibney T Pre-anesthesia Checklist: Patient identified, Timeout performed, Emergency Drugs available, Suction available and Patient being monitored Patient Re-evaluated:Patient Re-evaluated prior to inductionOxygen Delivery Method: Simple face mask and Circle system utilized Preoxygenation: Pre-oxygenation with 100% oxygen Intubation Type: IV induction Ventilation: Mask ventilation without difficulty Laryngoscope Size: Miller and 2 Grade View: Grade II Tube type: Oral Tube size: 7.0 mm Number of attempts: 1 Airway Equipment and Method: Patient positioned with wedge pillow and Stylet Placement Confirmation: ETT inserted through vocal cords under direct vision,  positive ETCO2 and breath sounds checked- equal and bilateral Secured at: 22 cm Tube secured with: Tape Dental Injury: Teeth and Oropharynx as per pre-operative assessment

## 2014-09-14 NOTE — Progress Notes (Signed)
Utilization review completed.  

## 2014-09-14 NOTE — Progress Notes (Signed)
I greatly appreciate the input from cardiology and the hospitalist service. She is a extremely high risk when considering surgical intervention. This is effectively a palliative intervention in order to optimize her symptoms and optimize her function. Her son has clearly said that her wishes were to never lose the ability to walk, and that she would not want to continue living in this state. We are not left with many ideal options. She has substantial risk with the aortic stenosis. I will plan to have another discussion with the son, and make sure that there wishes are to proceed with surgical intervention, and if so then we will likely proceed with surgery later on today.  Johnny Bridge, MD

## 2014-09-14 NOTE — Anesthesia Preprocedure Evaluation (Addendum)
Anesthesia Evaluation  Patient identified by MRN, date of birth, ID band  Reviewed: Allergy & Precautions, H&P , NPO status , Patient's Chart, lab work & pertinent test results  History of Anesthesia Complications (+) PONV  Airway Mallampati: I       Dental  (+) Teeth Intact   Pulmonary  breath sounds clear to auscultation        Cardiovascular hypertension, + Valvular Problems/Murmurs AS Rhythm:Regular Rate:Tachycardia + Systolic murmurs    Neuro/Psych Some element of dementia     GI/Hepatic   Endo/Other    Renal/GU Renal disease     Musculoskeletal  (+) Arthritis -,   Abdominal   Peds  Hematology  (+) anemia ,   Anesthesia Other Findings   Reproductive/Obstetrics                            Anesthesia Physical Anesthesia Plan  ASA: IV  Anesthesia Plan: General   Post-op Pain Management:    Induction: Intravenous  Airway Management Planned: Oral ETT  Additional Equipment: Arterial line  Intra-op Plan:   Post-operative Plan: Extubation in OR and Possible Post-op intubation/ventilation  Informed Consent: I have reviewed the patients History and Physical, chart, labs and discussed the procedure including the risks, benefits and alternatives for the proposed anesthesia with the patient or authorized representative who has indicated his/her understanding and acceptance.     Plan Discussed with: CRNA and Surgeon  Anesthesia Plan Comments:        Anesthesia Quick Evaluation

## 2014-09-14 NOTE — Progress Notes (Signed)
Orthopedic Tech Progress Note Patient Details:  Nicole Harvey [birth year] [id number]  Patient ID: Nicole Harvey, female   DOB: Mar 27, 1928, 80 y.o.   MRN: [id number] RN states that pt is unable to use trapeze bar patient helper  Hildred Priest 09/14/2014, 8:07 PM

## 2014-09-14 NOTE — Discharge Instructions (Signed)
Diet: As you were doing prior to hospitalization  ° °Shower:  May shower but keep the wounds dry, use an occlusive plastic wrap, NO SOAKING IN TUB.  If the bandage gets wet, change with a clean dry gauze. ° °Dressing:  You may change your dressing 3-5 days after surgery.  Then change the dressing daily with sterile gauze dressing.   ° °There are sticky tapes (steri-strips) on your wounds and all the stitches are absorbable.  Leave the steri-strips in place when changing your dressings, they will peel off with time, usually 2-3 weeks. ° °Activity:  Increase activity slowly as tolerated, but follow the weight bearing instructions below.  No lifting or driving for 6 weeks. ° °Weight Bearing:   As tolerated.   ° °To prevent constipation: you may use a stool softener such as - ° °Colace (over the counter) 100 mg by mouth twice a day  °Drink plenty of fluids (prune juice may be helpful) and high fiber foods °Miralax (over the counter) for constipation as needed.   ° °Itching:  If you experience itching with your medications, try taking only a single pain pill, or even half a pain pill at a time.  You may take up to 10 pain pills per day, and you can also use benadryl over the counter for itching or also to help with sleep.  ° °Precautions:  If you experience chest pain or shortness of breath - call 911 immediately for transfer to the hospital emergency department!! ° °If you develop a fever greater that 101 F, purulent drainage from wound, increased redness or drainage from wound, or calf pain -- Call the office at [phone]                                                °Follow- Up Appointment:  Please call for an appointment to be seen in 2 weeks [place] - [phone] ° ° ° ° ° °

## 2014-09-14 NOTE — Progress Notes (Addendum)
INITIAL NUTRITION ASSESSMENT  Pt meets criteria for SEVERE MALNUTRITION in the context of chronic illness as evidenced by a 15% weight loss in 10 months and severe fat and muscle mass loss.  DOCUMENTATION CODES Per approved criteria  -Severe malnutrition in the context of chronic illness -Underweight   INTERVENTION: Once diet advances, recommend providing Ensure Complete po BID, each supplement provides 350 kcal and 13 grams of protein.  Will continue to monitor.  NUTRITION DIAGNOSIS: Malnutrition related to chronic illness as evidenced by severe fat and muscle mass loss.   Goal: Pt to meet >/= 90% of their estimated nutrition needs   Monitor:  Diet advancement, weight trends, labs, I/O's  Reason for Assessment: MST  80 y.o. female  Admitting Dx: Intertrochanteric fracture of right hip  ASSESSMENT: Pt seen for progressive weakness of right leg and decrease in ambulation. No recollection of fall or trauma to the right leg. Last known fall was approximately six months ago. Pt's son who is the primary caregiver notes that the pt has dementia. The son states that the pt has had a decrease in ambulation over the last three weeks.  Son was at pt bedside. Son reports pt's appetite has been good. Pt has been able to eat 3 meals a day, however son reports he has been noticing that portions consumed at meals are becoming less and less over time. Son reports pt has also been gradually losing weight over time as well. Pt with a 15% weight loss in 10 months. Pt reports she is willing to try Ensure once diet is advanced. Will order once pt can eat again.  Nutrition Focused Physical Exam:  Subcutaneous Fat:  Orbital Region: N/A Upper Arm Region: Severe depletion Thoracic and Lumbar Region: Severe depletion  Muscle:  Temple Region: Severe depletion Clavicle Bone Region: Severe depletion Clavicle and Acromion Bone Region: Severe depletion Scapular Bone Region: N/A Dorsal Hand: Severe  depletion Patellar Region: Severe depletion Anterior Thigh Region: Severe depletion Posterior Calf Region: Severe depletion  Edema: none  Labs: Low potassium and creatinine.  Height: Ht Readings from Last 1 Encounters:  09/13/14 5\' 3"  (1.6 m)    Weight: Wt Readings from Last 1 Encounters:  09/13/14 93 lb (42.185 kg)    Ideal Body Weight: 115 lbs  % Ideal Body Weight: 81%  Wt Readings from Last 10 Encounters:  09/13/14 93 lb (42.185 kg)  09/13/14 93 lb (42.185 kg)  09/10/14 93 lb (42.185 kg)  05/24/14 98 lb (44.453 kg)  02/10/14 112 lb (50.803 kg)  11/28/13 110 lb (49.896 kg)  10/25/13 104 lb 8.6 oz (47.418 kg)  10/10/13 110 lb (49.896 kg)  08/31/13 108 lb (48.988 kg)  07/19/13 108 lb (48.988 kg)    Usual Body Weight: 100 lbs  % Usual Body Weight: 93%  BMI:  Body mass index is 16.48 kg/(m^2). Underweight  Estimated Nutritional Needs: Kcal: 1500-1700 Protein: 60-75 grams Fluid: >/= 1.5 L/day  Skin: incision right hip  Diet Order: NPO  EDUCATION NEEDS: -Education not appropriate at this time   Intake/Output Summary (Last 24 hours) at 09/14/14 1006 Last data filed at 09/14/14 0630  Gross per 24 hour  Intake      0 ml  Output   1100 ml  Net  -1100 ml    Last BM: 10/26  Labs:   Recent Labs Lab 09/10/14 1326 09/13/14 1515 09/14/14 0631  NA 142 144 143  K 3.8 3.5* 3.5*  CL 104 104 104  CO2 24 27 27  BUN 14 12 15   CREATININE 0.49* 0.49* 0.39*  CALCIUM 9.3 9.3 9.2  GLUCOSE 156* 134* 92    CBG (last 3)  No results found for this basename: GLUCAP,  in the last 72 hours  Scheduled Meds: . docusate sodium  100 mg Oral BID  . heparin  5,000 Units Subcutaneous 3 times per day  . pantoprazole  40 mg Oral Daily  . predniSONE  10 mg Oral Q breakfast  . sodium chloride  3 mL Intravenous Q12H  . sodium chloride  3 mL Intravenous Q12H    Continuous Infusions: . 0.9 % NaCl with KCl 40 mEq / L      Past Medical History  Diagnosis Date  .  Anemia   . PONV (postoperative nausea and vomiting)   . 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 Family history of anesthesia complication     "son did; don't remember what reaction was though" (10/25/2013)  . High cholesterol   . Heart murmur   . Pneumonia     "when I was a little girl" (10/25/2013)  . History of blood transfusion     "quite a few; related to Myelodysplastic" (10/25/2013)  . Dementia     "don't know kind or stage" (10/25/2013)  . Arthritis     "hands" (10/25/2013)  . Kidney stones   . Myelodysplastic syndrome, low grade 10/09/2011  . Skin cancer of face     "several cut off face" (10/25/2013)    Past Surgical History  Procedure Laterality Date  . Kidney stone surgery  ?1970's  . Tonsillectomy    . Vaginal hysterectomy      Kallie Locks, MS, RD, LDN Pager # [phone] After hours/ weekend pager # [phone]

## 2014-09-14 NOTE — Clinical Social Work Note (Signed)
Patient alert and only oriented to person.  CSW left message with son, Fritz Pickerel and is awaiting a return call.  Per report, patient scheduled for OR today and will possibly need SNF/STR once medically stable.  Nonnie Done, [place] [phone]  Psychiatric & Orthopedics (5N 1-16) Clinical Social Worker

## 2014-09-14 NOTE — Anesthesia Postprocedure Evaluation (Signed)
  Anesthesia Post-op Note  Patient: Nicole Harvey  Procedure(s) Performed: Procedure(s): RIGHT HIP BIPOLAR ARTHROPLASTY  (Right)  Patient Location: PACU  Anesthesia Type:General  Level of Consciousness: awake, alert  and confused  Airway and Oxygen Therapy: Patient Spontanous Breathing  Post-op Pain: none  Post-op Assessment: Post-op Vital signs reviewed  Post-op Vital Signs: Reviewed  Last Vitals:  Filed Vitals:   09/14/14 1745  BP: 142/69  Pulse: 78  Temp: 36.1 C  Resp: 19    Complications: No apparent anesthesia complications

## 2014-09-14 NOTE — Transfer of Care (Signed)
Immediate Anesthesia Transfer of Care Note  Patient: Nicole Harvey  Procedure(s) Performed: Procedure(s): RIGHT HIP BIPOLAR ARTHROPLASTY  (Right)  Patient Location: PACU  Anesthesia Type:General  Level of Consciousness: awake and alert   Airway & Oxygen Therapy: Patient Spontanous Breathing and Patient connected to nasal cannula oxygen  Post-op Assessment: Report given to PACU RN, Post -op Vital signs reviewed and stable and Patient moving all extremities X 4  Post vital signs: Reviewed and stable  Complications: No apparent anesthesia complications

## 2014-09-14 NOTE — Progress Notes (Signed)
disussed with Fritz Pickerel, patients son DPOA who would like for [place] to proceed with surgery despite the high risk.  Will plan surgery later today.  Johnny Bridge, MD

## 2014-09-14 NOTE — Progress Notes (Signed)
Pt trying to pull at lines, get OOB.  placed in Bil wrist restraints per MD

## 2014-09-14 NOTE — Progress Notes (Signed)
  Echocardiogram 2D Echocardiogram has been performed.  Nicole Harvey FRANCES 09/14/2014, 9:44 AM

## 2014-09-14 NOTE — Progress Notes (Signed)
Patient is very confused and agitated. Orders received for Ativan 0.5 and foley, will continue to monitor.

## 2014-09-15 DIAGNOSIS — G8918 Other acute postprocedural pain: Secondary | ICD-10-CM

## 2014-09-15 DIAGNOSIS — G9340 Encephalopathy, unspecified: Secondary | ICD-10-CM

## 2014-09-15 DIAGNOSIS — R634 Abnormal weight loss: Secondary | ICD-10-CM

## 2014-09-15 DIAGNOSIS — Z515 Encounter for palliative care: Secondary | ICD-10-CM

## 2014-09-15 LAB — BASIC METABOLIC PANEL
Anion gap: 14 (ref 5–15)
BUN: 12 mg/dL (ref 6–23)
CALCIUM: 9.1 mg/dL (ref 8.4–10.5)
CHLORIDE: 103 meq/L (ref 96–112)
CO2: 25 meq/L (ref 19–32)
CREATININE: 0.42 mg/dL — AB (ref 0.50–1.10)
GFR calc Af Amer: >90 mL/min (ref >90)
GFR calc non Af Amer: >90 mL/min (ref >90)
GLUCOSE: 101 mg/dL — AB (ref 70–99)
Potassium: 4.1 mEq/L (ref 3.7–5.3)
Sodium: 142 mEq/L (ref 137–147)

## 2014-09-15 LAB — CBC
HEMATOCRIT: 30.4 % — AB (ref 36.0–46.0)
Hemoglobin: 10 g/dL — ABNORMAL LOW (ref 12.0–15.0)
MCH: 29.5 pg (ref 26.0–34.0)
MCHC: 32.9 g/dL (ref 30.0–36.0)
MCV: 89.7 fL (ref 78.0–100.0)
PLATELETS: 287 10*3/uL (ref 150–400)
RBC: 3.39 MIL/uL — AB (ref 3.87–5.11)
RDW: 15.2 % (ref 11.5–15.5)
WBC: 7.4 10*3/uL (ref 4.0–10.5)

## 2014-09-15 MED ORDER — KCL IN DEXTROSE-NACL 10-5-0.45 MEQ/L-%-% IV SOLN
INTRAVENOUS | Status: AC
  Administered 2014-09-15 – 2014-09-16 (×2): 50 mL/h via INTRAVENOUS
  Filled 2014-09-15 (×2): qty 1000

## 2014-09-15 MED ORDER — HALOPERIDOL LACTATE 5 MG/ML IJ SOLN
2.0000 mg | Freq: Once | INTRAMUSCULAR | Status: DC
  Filled 2014-09-15: qty 1

## 2014-09-15 MED ORDER — ACETAMINOPHEN 500 MG PO TABS
1000.0000 mg | ORAL_TABLET | Freq: Three times a day (TID) | ORAL | Status: DC
  Filled 2014-09-15 (×3): qty 2

## 2014-09-15 MED ORDER — HALOPERIDOL LACTATE 5 MG/ML IJ SOLN
1.0000 mg | Freq: Four times a day (QID) | INTRAMUSCULAR | Status: DC | PRN
  Administered 2014-09-15: 1 mg via INTRAVENOUS
  Filled 2014-09-15: qty 1

## 2014-09-15 MED ORDER — HALOPERIDOL LACTATE 5 MG/ML IJ SOLN
2.0000 mg | Freq: Four times a day (QID) | INTRAMUSCULAR | Status: DC | PRN
  Administered 2014-09-15: 1 mg via INTRAVENOUS
  Filled 2014-09-15: qty 1

## 2014-09-15 MED ORDER — HALOPERIDOL LACTATE 5 MG/ML IJ SOLN
2.0000 mg | Freq: Every day | INTRAMUSCULAR | Status: DC
  Administered 2014-09-15: 2 mg via INTRAVENOUS
  Filled 2014-09-15: qty 0.4
  Filled 2014-09-15 (×2): qty 1

## 2014-09-15 MED ORDER — METHYLPREDNISOLONE SODIUM SUCC 40 MG IJ SOLR
20.0000 mg | INTRAMUSCULAR | Status: DC
  Administered 2014-09-15 – 2014-09-16 (×2): 20 mg via INTRAVENOUS
  Filled 2014-09-15 (×3): qty 0.5

## 2014-09-15 MED ORDER — HALOPERIDOL LACTATE 5 MG/ML IJ SOLN
2.0000 mg | INTRAMUSCULAR | Status: DC | PRN
  Administered 2014-09-16 (×2): 2 mg via INTRAVENOUS
  Filled 2014-09-15: qty 1

## 2014-09-15 NOTE — Progress Notes (Signed)
Patient ID: JALEY YAN, female   DOB: 25-Apr-1928, 80 y.o.   MRN: [id number]   Patient Name: Nicole Harvey Date of Encounter: 09/15/2014     Principal Problem:   Fracture of femoral neck, right Active Problems:   Myelodysplastic syndrome, low grade   Intertrochanteric fracture of right hip   Dementia with behavioral disturbance   Protein-calorie malnutrition, severe    SUBJECTIVE  "my pants hurt", appears to be sore from surgery.  CURRENT MEDS . docusate sodium  100 mg Oral BID  . enoxaparin (LOVENOX) injection  30 mg Subcutaneous Q24H  . pantoprazole  40 mg Oral Daily  . predniSONE  10 mg Oral Q breakfast  . senna  1 tablet Oral BID    OBJECTIVE  Filed Vitals:   09/15/14 0412 09/15/14 0600 09/15/14 0800 09/15/14 0842  BP: 102/76 112/63 114/61 116/66  Pulse: 121  101 99  Temp: 97.9 F (36.6 C)   97.7 F (36.5 C)  TempSrc: Oral   Axillary  Resp: 22  20 18   Height:      Weight: 97 lb (44 kg)     SpO2: 100%  97% 100%    Intake/Output Summary (Last 24 hours) at 09/15/14 1110 Last data filed at 09/15/14 0900  Gross per 24 hour  Intake   1350 ml  Output   2125 ml  Net   -775 ml   Filed Weights   09/13/14 1111 09/15/14 0412  Weight: 93 lb (42.185 kg) 97 lb (44 kg)    PHYSICAL EXAM  General: elderly and frail, in mild mild pain Neuro: Alert and oriented X 1. Moves all extremities spontaneously. HEENT:  Normal  Neck: Supple without bruits or JVD. Lungs:  Resp regular and unlabored, CTA. Heart: RRR no s3, s4, or murmurs. Abdomen: Soft, non-tender, non-distended, BS + x 4.  Extremities: No clubbing, cyanosis or edema. DP/PT/Radials 2+ and equal bilaterally. Bandage is dry  Accessory Clinical Findings  CBC  Recent Labs  09/13/14 1515 09/14/14 0631 09/15/14 0550  WBC 6.5 6.6 7.4  NEUTROABS 5.0  --   --   HGB 11.6* 11.7* 10.0*  HCT 35.6* 36.3 30.4*  MCV 89.9 91.2 89.7  PLT 275 267 101   Basic Metabolic Panel  Recent Labs   09/14/14 0631 09/15/14 0550  NA 143 142  K 3.5* 4.1  CL 104 103  CO2 27 25  GLUCOSE 92 101*  BUN 15 12  CREATININE 0.39* 0.42*  CALCIUM 9.2 9.1   Liver Function Tests  Recent Labs  09/13/14 1515  AST 15  ALT 18  ALKPHOS 337*  BILITOT 0.4  PROT 6.6  ALBUMIN 3.2*   No results found for this basename: LIPASE, AMYLASE,  in the last 72 hours Cardiac Enzymes  Recent Labs  09/13/14 1515  TROPONINI <0.30   BNP No components found with this basename: POCBNP,  D-Dimer No results found for this basename: DDIMER,  in the last 72 hours Hemoglobin A1C No results found for this basename: HGBA1C,  in the last 72 hours Fasting Lipid Panel No results found for this basename: CHOL, HDL, LDLCALC, TRIG, CHOLHDL, LDLDIRECT,  in the last 72 hours Thyroid Function Tests No results found for this basename: TSH, T4TOTAL, FREET3, T3FREE, THYROIDAB,  in the last 72 hours  TELE  Nsr/sinus tachy  Radiology/Studies  Dg Chest 2 View  09/10/2014   CLINICAL DATA:  For right hip arthroplasty, history of heart murmur  EXAM: CHEST  2 VIEW  COMPARISON:  Chest x-ray of 10/24/2013  FINDINGS: Linear opacities are noted both in lung bases which may reflect linear atelectasis or possibly scarring. No definite pneumonia or effusion is seen. The lungs are slightly hyperaerated. No mediastinal or hilar adenopathy is seen. The heart is mildly enlarged. The descending thoracic aorta is ectatic. The bones are diffusely osteopenic and multiple thoracic and lumbar compression deformities are noted which appear new compared to the lateral chest x-ray from December of 2014.  IMPRESSION: 1. Bibasilar linear atelectasis or scarring. 2. Osteopenia and multiple thoracic and lumbar vertebral compression deformities, new since December of 2014   Electronically Signed   By: Ivar Drape M.D.   On: 09/10/2014 13:39   Pelvis Portable  09/14/2014   CLINICAL DATA:  Right hip replacement.  EXAM: PORTABLE PELVIS 1-2 VIEWS   COMPARISON:  None.  FINDINGS: Right hip replacement with good anatomic alignment. No acute abnormality. Diffuse osteopenia.  IMPRESSION: Right hip replacement with good anatomic alignment.   Electronically Signed   By: [place]   On: 09/14/2014 17:26    ASSESSMENT AND PLAN 1. S/p hip fracture/repair 2. Critical Aortic stenosis 3. Chronic diastolic heart failure 4. Postop pain 5. Advanced age/dementia Rec: continue supportive care. She may need more pain medication. Volume status looks good. If anything, she may be a bit dry.  Gregg Taylor,M.D.  09/15/2014 11:10 AM

## 2014-09-15 NOTE — Evaluation (Signed)
Physical Therapy Evaluation Patient Details Name: Nicole Harvey MRN: [id number] DOB: 07/26/1928 Today's Date: 09/15/2014   History of Present Illness    80 y.o. female who presented initially to MD office with complaints of inability to walk. She was diagnosed with a femoral neck fracture, and ultimately brought into the hospital for preoperative optimization workup. The family elected for her to undergo hemiarthroplasty, despite severe aortic stenosis, failure to thrive, and advanced dementia.S/P RIGHT HIP BIPOLAR ARTHROPLASTY 10/30. Pre-op chest xray with Bibasilar linear atelectasis or scarring.      Clinical Impression  Pt admitted with above. Pt currently with functional limitations due to the deficits listed below (see PT Problem List). Pt will beenfit from SNF to progress to level where she will require less care.  Unsure of son's availability to assist.   Pt will benefit from skilled PT to increase their independence and safety with mobility to allow discharge to the venue listed below.     Follow Up Recommendations SNF;Supervision/Assistance - 24 hour    Equipment Recommendations  Other (comment) (TBA)    Recommendations for Other Services       Precautions / Restrictions Precautions Precautions: Fall;Posterior Hip Restrictions Weight Bearing Restrictions: Yes RLE Weight Bearing: Weight bearing as tolerated      Mobility  Bed Mobility Overal bed mobility: Needs Assistance;+2 for physical assistance Bed Mobility: Rolling;Supine to Sit;Sit to Supine Rolling: Mod assist;+2 for physical assistance   Supine to sit: Mod assist;+2 for physical assistance Sit to supine: Mod assist;+2 for physical assistance   General bed mobility comments: Needed assist for LEs and for elevation of trunk.  Also needed assist for upper body and to bring LEs up on bed to lie down.  Pt initiates movement only.    Transfers                 General transfer comment: unable to  assess  Ambulation/Gait                Stairs            Wheelchair Mobility    Modified Rankin (Stroke Patients Only)       Balance Overall balance assessment: Needs assistance;History of Falls Sitting-balance support: Bilateral upper extremity supported;Feet supported Sitting balance-Leahy Scale: Poor Sitting balance - Comments: Initially pt needed mod assist to sit EOB but once pt sat and was positioned was able to sit with alternating extremity support with min guard assist for 20 minutes at EOB.  ST came in and performed swallow eval while pt sitting on EOB.  Pt could kick LEs but not full ROm.                                       Pertinent Vitals/Pain Pain Assessment: Faces Faces Pain Scale: Hurts little more Pain Location: right hip Pain Descriptors / Indicators: Aching Pain Intervention(s): Limited activity within patient's tolerance;Monitored during session;Premedicated before session;Repositioned VSS    Home Living Family/patient expects to be discharged to:: Private residence Living Arrangements: Children (son is caregiver per chart) Available Help at Discharge: Family (unsure of sons availability)                  Prior Function           Comments: unsure and no family present     Hand Dominance        Extremity/Trunk Assessment  Upper Extremity Assessment: Defer to OT evaluation           Lower Extremity Assessment: RLE deficits/detail;LLE deficits/detail RLE Deficits / Details: grossly 2-/5 LLE Deficits / Details: grossly 2/5  Cervical / Trunk Assessment: Kyphotic  Communication   Communication: Other (comment) (difficult to understand speech)  Cognition Arousal/Alertness: Lethargic Behavior During Therapy: Restless;Flat affect Overall Cognitive Status: Impaired/Different from baseline Area of Impairment: Orientation;Attention;Memory;Following commands;Safety/judgement;Awareness;Problem  solving Orientation Level: Disoriented to;Place;Time;Situation Current Attention Level: Focused Memory: Decreased short-term memory Following Commands: Follows one step commands with increased time;Follows one step commands inconsistently Safety/Judgement: Decreased awareness of deficits;Decreased awareness of safety Awareness: Intellectual Problem Solving: Slow processing;Decreased initiation;Difficulty sequencing;Requires verbal cues;Requires tactile cues      General Comments General comments (skin integrity, edema, etc.): bruising noted on buttocks and back    Exercises General Exercises - Lower Extremity Ankle Circles/Pumps: AAROM;Both;5 reps;Supine Long Arc Quad: AAROM;Both;5 reps;Seated      Assessment/Plan    PT Assessment Patient needs continued PT services  PT Diagnosis Generalized weakness;Acute pain   PT Problem List Decreased activity tolerance;Decreased balance;Decreased strength;Decreased mobility;Decreased knowledge of use of DME;Decreased safety awareness;Decreased knowledge of precautions;Decreased skin integrity;Pain  PT Treatment Interventions DME instruction;Gait training;Functional mobility training;Therapeutic activities;Therapeutic exercise;Balance training;Patient/family education   PT Goals (Current goals can be found in the Care Plan section) Acute Rehab PT Goals Patient Stated Goal: to get better PT Goal Formulation: With patient Time For Goal Achievement: 09/29/14 Potential to Achieve Goals: Good    Frequency Min 5X/week   Barriers to discharge Decreased caregiver support      Co-evaluation PT/OT/SLP Co-Evaluation/Treatment: Yes Reason for Co-Treatment: Complexity of the patient's impairments (multi-system involvement) PT goals addressed during session: Mobility/safety with mobility         End of Session Equipment Utilized During Treatment: Oxygen Activity Tolerance: Patient limited by fatigue;Patient limited by pain Patient left: in  bed;with call bell/phone within reach;with bed alarm set;with restraints reapplied Nurse Communication: Mobility status;Need for lift equipment;Precautions (posted precautions in room)         Time: 7673-4193 PT Time Calculation (min): 39 min   Charges:   PT Evaluation $Initial PT Evaluation Tier I: 1 Procedure PT Treatments $Therapeutic Activity: 23-37 mins   PT G CodesDenice Paradise 10-05-14, 11:25 AM [facility] Acute Rehabilitation [phone] (pager)

## 2014-09-15 NOTE — Consult Note (Signed)
Patient BW:[id number] ROYCE SCIARA      DOB: 07/29/1928      TTS:[id number]     Consult Note from the Palliative Medicine Team at [facility] Requested by: Dr Mardelle Matte     PCP: Leonard Downing, MD Reason for Consultation:Symptom Management     Phone Number:[phone]  Assessment/Recommendations: 80 yo female with PMHx of   1.  Code Status: Full - I did not address today  2. Goals of Care: Son Hopeful of getting back some functional mobility. That is ultimate goal.  Has had rapid decline cognitively over past year. Less mobile as well but still at home with her husband (who has alzhemiers) and assistance of paid caregivers.  Has been losing weight, not as interested in doing enjoyable activities.  Discussed how trajectory over next few days will likely be key for her.  Longer delirium persists for, the more concerned I am for her ability to significantly recover from this episode. Will continue to follow along. Suspect goals to evolve as her functional/cogntive trajectory over past year will make it difficult for her to do well.      3. Symptom Management:   1. Acute Delirium- post-op in setting of underlying dementia which has rapidly progressed over past year. Agree with avoiding benzo's. Has haldol PRN.  I will schedule 2mg  dose at night to prevent alterations in her sleep/wake cycle.  I will add scheduled tylenol in case post-op pain is contributing. Monitor to make sure constipation does not develop. Try to remove restraints as soon as medically safe to do so.   2. Hip Pain- intemittent complains of pain to son. Will schedule Tylenol as above.   3.  Weight Loss- declining weight for past year. Was ~110lbs last year and now in 82's.  Can consider mirtazapine based on how she does. Her steroids probably are the more effective medication for this though.   4. Psychosocial/Spiritual: Lived at home with husband who has Alzheimers.  Paid caregivers in home. Rapid decline over  past year.  Son Fritz Pickerel very supportive and assists both parents frequently.     Brief HPI: 80 yo female with PMHx of Dementia, MDS, HLD, severe AS who presented with weakness of right leg and decreased ambulation for seeral weeks.  There is no reports of recent fall.  Direct admit with right intertrochanteric hip fracture now s/p operative repair. Severe post-op delirium has also complicated her stay.  Received few doses of PRN ativan and now on PRN haldol which she is yet to receive. Son reports that she has intermittently complained of hip pain but not consistent. She frequently tries to remove mitten restraints.  Very confused and somnolent most of day.  Son unsure about her sleep at night. No known constipation at home. Son describes rapid cognitive decline over past year. Still ambulatory but gait beginning to worsen as well. Needs assistance of walker.  Little enjoyable activities that I[place]ani has been participating in.    ROS: Unable to obtain with cognitive impairment    PMH:  Past Medical History  Diagnosis Date  . Anemia   . PONV (postoperative nausea and vomiting)   . 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 Family history of anesthesia complication     "son did; don't remember what reaction was though" (10/25/2013)  . High cholesterol   . Heart murmur   . Pneumonia     "when I was a little girl" (10/25/2013)  . History of blood transfusion     "  quite a few; related to Myelodysplastic" (10/25/2013)  . Dementia     "don't know kind or stage" (10/25/2013)  . Arthritis     "hands" (10/25/2013)  . Kidney stones   . Myelodysplastic syndrome, low grade 10/09/2011  . Skin cancer of face     "several cut off face" (10/25/2013)  . Fracture of femoral neck, right 09/13/2014     PSH: Past Surgical History  Procedure Laterality Date  . Kidney stone surgery  ?1970's  . Tonsillectomy    . Vaginal hysterectomy     I have reviewed the [place] and SH and  If appropriate update  it with new information. Allergies  Allergen Reactions  . Other Shortness Of Breath and Itching    Med for ???? Instructed patient to bring.Previous medication given by Oncologist.  . Codeine Nausea And Vomiting  . Epinephrine Other (See Comments)    Unknown  . Sulfa Antibiotics Other (See Comments)    Unknown   Scheduled Meds: . docusate sodium  100 mg Oral BID  . enoxaparin (LOVENOX) injection  30 mg Subcutaneous Q24H  . pantoprazole  40 mg Oral Daily  . predniSONE  10 mg Oral Q breakfast  . senna  1 tablet Oral BID   Continuous Infusions: . dextrose 5 % and 0.45 % NaCl with KCl 10 mEq/L 50 mL/hr (09/15/14 0938)   PRN Meds:.acetaminophen, alum & mag hydroxide-simeth, bisacodyl, haloperidol lactate, ondansetron (ZOFRAN) IV, polyethylene glycol    BP 116/66  Pulse 99  Temp(Src) 97.7 F (36.5 C) (Axillary)  Resp 18  Ht 5\' 3"  (1.6 m)  Wt 44 kg (97 lb)  BMI 17.19 kg/m2  SpO2 100%   PPS: Currently 20   Intake/Output Summary (Last 24 hours) at 09/15/14 1202 Last data filed at 09/15/14 0900  Gross per 24 hour  Intake   1350 ml  Output   1700 ml  Net   -350 ml    Physical Exam:  General: Picking at mitten restraints, appears agitated at times and somnolent at other times. HEENT:  [place], sclera anicteric Neck: thin Chest:   CTAB CVS: mild tachycardia Abdomen: soft, ND Ext: no edema Neuro: confused, intermittently follows commands and answers questions. Not oriented to place or time Skin: warm/dry Lymph: no palpable cervical adenopathy  Labs: CBC    Component Value Date/Time   WBC 7.4 09/15/2014 0550   WBC 6.5 11/28/2013 1105   RBC 3.39* 09/15/2014 0550   RBC 3.66* 11/28/2013 1105   RBC 3.52* 11/28/2013 1105   HGB 10.0* 09/15/2014 0550   HGB 11.1* 11/28/2013 1105   HCT 30.4* 09/15/2014 0550   HCT 33.7* 11/28/2013 1105   PLT 287 09/15/2014 0550   PLT 275 11/28/2013 1105   MCV 89.7 09/15/2014 0550   MCV 96 11/28/2013 1105   MCH 29.5 09/15/2014 0550   MCH 31.5  11/28/2013 1105   MCHC 32.9 09/15/2014 0550   MCHC 32.9 11/28/2013 1105   RDW 15.2 09/15/2014 0550   RDW 14.9 11/28/2013 1105   LYMPHSABS 0.8 09/13/2014 1515   LYMPHSABS 0.7* 11/28/2013 1105   MONOABS 0.6 09/13/2014 1515   EOSABS 0.1 09/13/2014 1515   EOSABS 0.0 11/28/2013 1105   BASOSABS 0.1 09/13/2014 1515   BASOSABS 0.1 11/28/2013 1105    BMET    Component Value Date/Time   NA 142 09/15/2014 0550   K 4.1 09/15/2014 0550   CL 103 09/15/2014 0550   CO2 25 09/15/2014 0550   GLUCOSE 101* 09/15/2014 0550   BUN  12 09/15/2014 0550   CREATININE 0.42* 09/15/2014 0550   CALCIUM 9.1 09/15/2014 0550   GFRNONAA >90 09/15/2014 0550   GFRAA >90 09/15/2014 0550    CMP     Component Value Date/Time   NA 142 09/15/2014 0550   K 4.1 09/15/2014 0550   CL 103 09/15/2014 0550   CO2 25 09/15/2014 0550   GLUCOSE 101* 09/15/2014 0550   BUN 12 09/15/2014 0550   CREATININE 0.42* 09/15/2014 0550   CALCIUM 9.1 09/15/2014 0550   PROT 6.6 09/13/2014 1515   ALBUMIN 3.2* 09/13/2014 1515   AST 15 09/13/2014 1515   ALT 18 09/13/2014 1515   ALKPHOS 337* 09/13/2014 1515   BILITOT 0.4 09/13/2014 1515   GFRNONAA >90 09/15/2014 0550   GFRAA >90 09/15/2014 0550   10/26 CXR IMPRESSION:  1. Bibasilar linear atelectasis or scarring.  2. Osteopenia and multiple thoracic and lumbar vertebral compression  deformities, new since December of 2014   10/30 Pelvic Xray IMPRESSION:  Right hip replacement with good anatomic alignment.   Greater than 50%  of this time was spent counseling and coordinating care related to the above assessment and plan.    Doran Clay D.O. Palliative Medicine Team at [facility]  Pager: [phone] Team Phone: [phone]

## 2014-09-15 NOTE — Evaluation (Addendum)
Clinical/Bedside Swallow Evaluation Patient Details  Name: Nicole Harvey MRN: [id number] Date of Birth: 04/29/1928  Today's Date: 09/15/2014 Time: 7026-3785 SLP Time Calculation (min): 41 min  Past Medical History:  Past Medical History  Diagnosis Date  . Anemia   . PONV (postoperative nausea and vomiting)   . 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 Family history of anesthesia complication     "son did; don't remember what reaction was though" (10/25/2013)  . High cholesterol   . Heart murmur   . Pneumonia     "when I was a little girl" (10/25/2013)  . History of blood transfusion     "quite a few; related to Myelodysplastic" (10/25/2013)  . Dementia     "don't know kind or stage" (10/25/2013)  . Arthritis     "hands" (10/25/2013)  . Kidney stones   . Myelodysplastic syndrome, low grade 10/09/2011  . Skin cancer of face     "several cut off face" (10/25/2013)  . Fracture of femoral neck, right 09/13/2014   Past Surgical History:  Past Surgical History  Procedure Laterality Date  . Kidney stone surgery  ?1970's  . Tonsillectomy    . Vaginal hysterectomy     HPI:  80 y.o. female who presented initially to MD office with complaints of inability to walk. She was diagnosed with a femoral neck fracture, and ultimately brought into the hospital for preoperative optimization workup. The family elected for her to undergo hemiarthroplasty, despite severe aortic stenosis, failure to thrive, and advanced dementia.S/P RIGHT HIP BIPOLAR ARTHROPLASTY 10/30. Pre-op chest xray with Bibasilar linear atelectasis or scarring.    Assessment / Plan / Recommendation Clinical Impression  Patient presents with a cognitively based oropharyngeal dysphagia with confusion, decreased sustained attention, and decreased awareness of bolus, improving with SLP assistance with repositioning, verbal, and tactile cueing. Subtle s/s of aspiration noted initially, decreasing as trials progressed  and cognition improved. Although patient appears to be largely protecting airway, concerned that aspiration risk will be significantly higher with fluctuating mentation. Initial recommendations were for NPO except meds crushed in puree. RN requested that SLP enter room after study to observe patient with uncrushable meds whole in puree. Pateint lethargic with inefficient oral acceptance of bolus. Recommend strict NPO with meds via alternative means.  Prognosis for ability to resume a po diet good with improving mentation. Discussed with RN and in agreement. SLP will f/u.     Aspiration Risk  Moderate    Diet Recommendation NPO   Medication Administration: Via alternative means    Other  Recommendations Oral Care Recommendations:  (QID)   Follow Up Recommendations  None    Frequency and Duration min 2x/week  1 week   Pertinent Vitals/Pain n/a        Swallow Study    General HPI: 80 y.o. female who presented initially to MD office with complaints of inability to walk. She was diagnosed with a femoral neck fracture, and ultimately brought into the hospital for preoperative optimization workup. The family elected for her to undergo hemiarthroplasty, despite severe aortic stenosis, failure to thrive, and advanced dementia.S/P RIGHT HIP BIPOLAR ARTHROPLASTY 10/30. Pre-op chest xray with Bibasilar linear atelectasis or scarring.  Type of Study: Bedside swallow evaluation Previous Swallow Assessment: none in chart Diet Prior to this Study: NPO Temperature Spikes Noted: No Respiratory Status: Nasal cannula History of Recent Intubation: Yes (surgery only) Date extubated: 09/14/14 Behavior/Cognition: Lethargic;Confused;Requires cueing;Decreased sustained attention Oral Cavity - Dentition: Adequate natural dentition Self-Feeding Abilities: Able to  feed self;Needs assist Patient Positioning: Upright in bed Baseline Vocal Quality: Low vocal intensity;Hoarse Volitional Cough: Cognitively unable  to elicit Volitional Swallow: Unable to elicit    Oral/Motor/Sensory Function Overall Oral Motor/Sensory Function: Appears within functional limits for tasks assessed (with the exception of generalized weakness)   Ice Chips Ice chips: Impaired Presentation: Spoon Pharyngeal Phase Impairments: Throat Clearing - Immediate (multiple swallows)   Thin Liquid Thin Liquid: Impaired Presentation: Cup;Self Fed;Straw Oral Phase Functional Implications: Left anterior spillage;Right anterior spillage    Nectar Thick Nectar Thick Liquid: Not tested   Honey Thick Honey Thick Liquid: Not tested   Puree Puree: Impaired Presentation: Spoon Oral Phase Impairments: Impaired anterior to posterior transit Oral Phase Functional Implications: Prolonged oral transit   Solid   GO   Nicole Kitt MA, CCC-SLP [phone]  Solid: Not tested       Nicole Harvey Nicole Harvey 09/15/2014,9:50 AM

## 2014-09-15 NOTE — Progress Notes (Signed)
Patient Demographics  Nicole Harvey, is a 80 y.o. female, DOB - 06-Apr-1928, YJE:[id number]  Admit date - 09/13/2014   Admitting Physician Kelvin Cellar, MD  Outpatient Primary MD for the patient is Leonard Downing, MD  LOS - 2   No chief complaint on file.       Subjective:   Nicole Harvey today is somnolent in bed, unable to provide review of systems or history. Appears comfortable.  Assessment & Plan    1. Intertrochanteric fracture of right hip - diagnosed by Dr. Mardelle Matte in the office where she presented with ongoing right hip discomfort, post open reduction internal fixation by Dr. Mardelle Matte on 09/14/2014. Tolerated the procedure well. Weightbearing as tolerated on the right leg per orthopedics, Lovenox for DVT prophylaxis 4 weeks.   2. Severe aortic stenosis. Valvular area 0.62 cm on echogram in May 2014. Strict intake and output monitor, avoid ACE/ARB or preload reduction, appreciate cardiology input for pre-operative evaluation.   3. Myelodysplastic syndrome. Chronic. Outpatient monitor with PCP. Chronically on prednisone. Monitor the need for stress dose steroids.    4. Advanced dementia. Currently in severe delirium. Supportive care. Avoid benzos, minimize narcotics, will require placement.    5. GERD. On PPI.     Code Status: Full  Family Communication: None present left message for some on the phone in detail 09/15/2014  Disposition Plan:   likely SNF   Procedures R.ORIF on 09/14/2014 by Dr. Mardelle Matte   Consults cardiology, orthopedics   Medications  Scheduled Meds: . docusate sodium  100 mg Oral BID  . enoxaparin (LOVENOX) injection  30 mg Subcutaneous Q24H  . pantoprazole  40 mg Oral Daily  . predniSONE  10 mg Oral Q breakfast  . senna  1 tablet  Oral BID   Continuous Infusions: . 0.45 % NaCl with KCl 20 mEq / L 75 mL/hr at 09/15/14 0800   PRN Meds:.acetaminophen, alum & mag hydroxide-simeth, bisacodyl, haloperidol lactate, ondansetron (ZOFRAN) IV, polyethylene glycol  DVT Prophylaxis   - Heparin    Lab Results  Component Value Date   PLT 287 09/15/2014    Antibiotics     Anti-infectives   Start     Dose/Rate Route Frequency Ordered Stop   09/14/14 1900  ceFAZolin (ANCEF) IVPB 2 g/50 mL premix     2 g 100 mL/hr over 30 Minutes Intravenous Every 6 hours 09/14/14 1806 09/15/14 0213   09/13/14 1114  ceFAZolin (ANCEF) IVPB 2 g/50 mL premix  Status:  Discontinued     2 g 100 mL/hr over 30 Minutes Intravenous On call to O.R. 09/13/14 1114 09/14/14 1806          Objective:   Filed Vitals:   09/15/14 0412 09/15/14 0600 09/15/14 0800 09/15/14 0842  BP: 102/76 112/63 114/61 116/66  Pulse: 121  101 99  Temp: 97.9 F (36.6 C)   97.7 F (36.5 C)  TempSrc: Oral   Axillary  Resp: 22  20 18   Height:      Weight: 44 kg (97 lb)     SpO2: 100%  97% 100%    Wt Readings from Last 3 Encounters:  09/15/14 44 kg (97 lb)  09/15/14 44 kg (97 lb)  09/10/14 42.185 kg (93 lb)  Intake/Output Summary (Last 24 hours) at 09/15/14 0848 Last data filed at 09/15/14 0800  Gross per 24 hour  Intake   1350 ml  Output   1925 ml  Net   -575 ml     Physical Exam  Awake , confused, Oriented X 0, No new F.N deficits, Normal affect [place].AT,PERRAL Supple Neck,No JVD, No cervical lymphadenopathy appriciated.  Symmetrical Chest wall movement, Good air movement bilaterally, CTAB RRR,No Gallops,Rubs or new Murmurs, No Parasternal Heave +ve B.Sounds, Abd Soft, No tenderness, No organomegaly appriciated, No rebound - guarding or rigidity. Foley in place. No Cyanosis, Clubbing or edema, No new Rash or bruise      Data Review   Micro Results Recent Results (from the past 240 hour(s))  SURGICAL PCR SCREEN     Status: None   Collection  Time    09/10/14  1:26 PM      Result Value Ref Range Status   MRSA, PCR NEGATIVE  NEGATIVE Final   Staphylococcus aureus NEGATIVE  NEGATIVE Final   Comment:            The Xpert SA Assay (FDA     approved for NASAL specimens     in patients over 37 years of age),     is one component of     a comprehensive surveillance     program.  Test performance has     been validated by [employer] for patients greater     than or equal to 61 year old.     It is not intended     to diagnose infection nor to     guide or monitor treatment.  SURGICAL PCR SCREEN     Status: None   Collection Time    09/14/14 12:35 AM      Result Value Ref Range Status   MRSA, PCR NEGATIVE  NEGATIVE Final   Staphylococcus aureus NEGATIVE  NEGATIVE Final   Comment:            The Xpert SA Assay (FDA     approved for NASAL specimens     in patients over 24 years of age),     is one component of     a comprehensive surveillance     program.  Test performance has     been validated by [employer] for patients greater     than or equal to 28 year old.     It is not intended     to diagnose infection nor to     guide or monitor treatment.  MRSA PCR SCREENING     Status: None   Collection Time    09/14/14  6:47 PM      Result Value Ref Range Status   MRSA by PCR NEGATIVE  NEGATIVE Final   Comment:            The GeneXpert MRSA Assay (FDA     approved for NASAL specimens     only), is one component of a     comprehensive MRSA colonization     surveillance program. It is not     intended to diagnose MRSA     infection nor to guide or     monitor treatment for     MRSA infections.    Radiology Reports Dg Chest 2 View  09/10/2014   CLINICAL DATA:  For right hip arthroplasty, history of heart murmur  EXAM: CHEST  2 VIEW  COMPARISON:  Chest x-ray of 10/24/2013  FINDINGS: Linear opacities are noted both in lung bases which may reflect linear atelectasis or possibly scarring. No definite pneumonia  or effusion is seen. The lungs are slightly hyperaerated. No mediastinal or hilar adenopathy is seen. The heart is mildly enlarged. The descending thoracic aorta is ectatic. The bones are diffusely osteopenic and multiple thoracic and lumbar compression deformities are noted which appear new compared to the lateral chest x-ray from December of 2014.  IMPRESSION: 1. Bibasilar linear atelectasis or scarring. 2. Osteopenia and multiple thoracic and lumbar vertebral compression deformities, new since December of 2014   Electronically Signed   By: Ivar Drape M.D.   On: 09/10/2014 13:39     CBC  Recent Labs Lab 09/10/14 1326 09/13/14 1515 09/14/14 0631 09/15/14 0550  WBC 8.3 6.5 6.6 7.4  HGB 11.4* 11.6* 11.7* 10.0*  HCT 36.1 35.6* 36.3 30.4*  PLT 288 275 267 287  MCV 92.6 89.9 91.2 89.7  MCH 29.2 29.3 29.4 29.5  MCHC 31.6 32.6 32.2 32.9  RDW 15.0 15.1 15.0 15.2  LYMPHSABS  --  0.8  --   --   MONOABS  --  0.6  --   --   EOSABS  --  0.1  --   --   BASOSABS  --  0.1  --   --     Chemistries   Recent Labs Lab 09/10/14 1326 09/13/14 1515 09/14/14 0631 09/15/14 0550  NA 142 144 143 142  K 3.8 3.5* 3.5* 4.1  CL 104 104 104 103  CO2 24 27 27 25   GLUCOSE 156* 134* 92 101*  BUN 14 12 15 12   CREATININE 0.49* 0.49* 0.39* 0.42*  CALCIUM 9.3 9.3 9.2 9.1  AST  --  15  --   --   ALT  --  18  --   --   ALKPHOS  --  337*  --   --   BILITOT  --  0.4  --   --    ------------------------------------------------------------------------------------------------------------------ estimated creatinine clearance is 36.4 ml/min (by C-G formula based on Cr of 0.42). ------------------------------------------------------------------------------------------------------------------ No results found for this basename: HGBA1C,  in the last 72 hours ------------------------------------------------------------------------------------------------------------------ No results found for this basename: CHOL,  HDL, LDLCALC, TRIG, CHOLHDL, LDLDIRECT,  in the last 72 hours ------------------------------------------------------------------------------------------------------------------ No results found for this basename: TSH, T4TOTAL, FREET3, T3FREE, THYROIDAB,  in the last 72 hours ------------------------------------------------------------------------------------------------------------------ No results found for this basename: VITAMINB12, FOLATE, FERRITIN, TIBC, IRON, RETICCTPCT,  in the last 72 hours  Coagulation profile  Recent Labs Lab 09/13/14 1515  INR 1.14    No results found for this basename: DDIMER,  in the last 72 hours  Cardiac Enzymes  Recent Labs Lab 09/13/14 1515  TROPONINI <0.30   ------------------------------------------------------------------------------------------------------------------ No components found with this basename: POCBNP,      Time Spent in minutes   35   SINGH,PRASHANT K M.D on 09/15/2014 at 8:48 AM  Between 7am to 7pm - Pager - [phone]  After 7pm go to [email or web address] - password [email or web address]  And look for the night coverage person covering for me after hours  [facility] Office  [phone]

## 2014-09-15 NOTE — Progress Notes (Addendum)
1520:  MD contacted by nurse due to pt remains restless/combative.  MD wrote orders and instructed nurse to administer an additional 1mg  of Halodol now for a total of 2mg  since 1420.  Nurse will continue to monitor.  1451:  Pt remains agitated post PRN, pt is pulling at equipment and trying to get out of bed.  MD contacted and informed, at the time of 1519 no additonal orders were written  1413:  Nurse was called into pt room due to bed alarm, pt was attempting to crawl out of bed, pt removed immobilizer and was very combative.  Nurse attempted to reorient pt and replaced immobilizer, PRN haldol was administered.  Pt is resting post administration.  QTC was measured prior to administration .50mso .  Nurse will continue to montor

## 2014-09-15 NOTE — Progress Notes (Signed)
Subjective: 1 Day Post-Op Procedure(s) (LRB): RIGHT HIP BIPOLAR ARTHROPLASTY  (Right) Patient reports pain as 1 on 0-10 scale.  Patient somewhat somnolent.  Patient is not oriented to person, place or time.  This is a little worse than her baseline according to son who is at baseline  Objective: Vital signs in last 24 hours: Temp:  [97 F (36.1 C)-98 F (36.7 C)] 97.7 F (36.5 C) (10/31 0842) Pulse Rate:  [39-121] 99 (10/31 0842) Resp:  [12-34] 18 (10/31 0842) BP: (94-206)/(56-126) 116/66 mmHg (10/31 0842) SpO2:  [90 %-100 %] 100 % (10/31 0842) Arterial Line BP: (157-190)/(79-94) 179/89 mmHg (10/30 1655) Weight:  [44 kg (97 lb)] 44 kg (97 lb) (10/31 0412)  Intake/Output from previous day: 10/30 0701 - 10/31 0700 In: 1350 [I.V.:1350] Out: 1525 [Urine:1325; Blood:200] Intake/Output this shift: Total I/O In: -  Out: 600 [Urine:600]   Recent Labs  09/13/14 1515 09/14/14 0631 09/15/14 0550  HGB 11.6* 11.7* 10.0*    Recent Labs  09/14/14 0631 09/15/14 0550  WBC 6.6 7.4  RBC 3.98 3.39*  HCT 36.3 30.4*  PLT 267 287    Recent Labs  09/14/14 0631 09/15/14 0550  NA 143 142  K 3.5* 4.1  CL 104 103  CO2 27 25  BUN 15 12  CREATININE 0.39* 0.42*  GLUCOSE 92 101*  CALCIUM 9.2 9.1    Recent Labs  09/13/14 1515  INR 1.14    Neurovascular intact Sensation intact distally Intact pulses distally Compartment soft No drainage noted through dressing  Assessment/Plan: 1 Day Post-Op Procedure(s) (LRB): RIGHT HIP BIPOLAR ARTHROPLASTY  (Right) Advance diet Up with therapy WBAT RLE Posterior hip precautions Dry dressing change prn Continue plan per medicine and cardiology   ANTON, M. LINDSEY 09/15/2014, 12:02 PM

## 2014-09-16 DIAGNOSIS — Z515 Encounter for palliative care: Secondary | ICD-10-CM | POA: Insufficient documentation

## 2014-09-16 DIAGNOSIS — E43 Unspecified severe protein-calorie malnutrition: Secondary | ICD-10-CM

## 2014-09-16 DIAGNOSIS — R634 Abnormal weight loss: Secondary | ICD-10-CM | POA: Insufficient documentation

## 2014-09-16 MED ORDER — HYDROCODONE-ACETAMINOPHEN 5-325 MG PO TABS
1.0000 | ORAL_TABLET | ORAL | Status: DC | PRN
Start: 2014-09-16 — End: 2014-09-18
  Administered 2014-09-16 – 2014-09-18 (×3): 1 via ORAL
  Filled 2014-09-16 (×3): qty 1

## 2014-09-16 MED ORDER — HALOPERIDOL LACTATE 5 MG/ML IJ SOLN
5.0000 mg | Freq: Every day | INTRAMUSCULAR | Status: DC
  Filled 2014-09-16: qty 1

## 2014-09-16 MED ORDER — HALOPERIDOL LACTATE 5 MG/ML IJ SOLN
2.0000 mg | Freq: Once | INTRAMUSCULAR | Status: AC
  Administered 2014-09-16: 2 mg via INTRAVENOUS

## 2014-09-16 MED ORDER — ACETAMINOPHEN 10 MG/ML IV SOLN
1000.0000 mg | Freq: Four times a day (QID) | INTRAVENOUS | Status: AC
  Administered 2014-09-16 – 2014-09-17 (×4): 1000 mg via INTRAVENOUS
  Filled 2014-09-16 (×4): qty 100

## 2014-09-16 MED ORDER — MORPHINE SULFATE 2 MG/ML IJ SOLN
2.0000 mg | INTRAMUSCULAR | Status: DC | PRN
  Administered 2014-09-16 – 2014-09-17 (×4): 2 mg via INTRAVENOUS
  Filled 2014-09-16 (×4): qty 1

## 2014-09-16 MED ORDER — RESOURCE THICKENUP CLEAR PO POWD
ORAL | Status: DC | PRN
  Filled 2014-09-16: qty 125

## 2014-09-16 MED ORDER — HALOPERIDOL LACTATE 5 MG/ML IJ SOLN
2.0000 mg | Freq: Three times a day (TID) | INTRAMUSCULAR | Status: DC
  Administered 2014-09-16 – 2014-09-18 (×3): 2 mg via INTRAVENOUS
  Filled 2014-09-16: qty 1
  Filled 2014-09-16 (×6): qty 0.4

## 2014-09-16 MED ORDER — TRAZODONE HCL 50 MG PO TABS
50.0000 mg | ORAL_TABLET | Freq: Every day | ORAL | Status: DC
  Administered 2014-09-16 – 2014-09-17 (×2): 50 mg via ORAL
  Filled 2014-09-16 (×3): qty 1

## 2014-09-16 MED ORDER — METOPROLOL TARTRATE 50 MG PO TABS
50.0000 mg | ORAL_TABLET | Freq: Two times a day (BID) | ORAL | Status: DC
  Administered 2014-09-16 – 2014-09-17 (×3): 50 mg via ORAL
  Filled 2014-09-16 (×7): qty 1

## 2014-09-16 NOTE — Progress Notes (Signed)
OT Cancellation Note  Patient Details Name: Nicole Harvey MRN: [id number] DOB: [birth year]   Cancelled Treatment:    Reason Eval/Treat Not Completed: Patient at procedure or test/ unavailable. Pt was with SLP when OT attempted to see. OT will follow up as available to complete evaluation. Of note, palliative care involved and pt has been agitated. OT may need to communicate with family regarding role of therapy and POC to decide appropriateness of OT.   [place] Herb M   Cyndie Chime, OTR/L Occupational Therapist [phone] (pager)  09/16/2014, 5:05 PM

## 2014-09-16 NOTE — Progress Notes (Signed)
Patient states "it hurts" but unable to tell where and give a pain level due to dementia.

## 2014-09-16 NOTE — Progress Notes (Addendum)
Subjective: 2 Days Post-Op Procedure(s) (LRB): RIGHT HIP BIPOLAR ARTHROPLASTY  (Right) Patient reports pain as 1 on 0-10 scale.  Patient  Is very alert today and has been non-combative thus far.  No lightheadedness/dizziness, nausea/vomiting, chest pain or sob.    Objective: Vital signs in last 24 hours: Temp:  [97.7 F (36.5 C)-98.4 F (36.9 C)] 98.1 F (36.7 C) (11/01 0609) Pulse Rate:  [104-134] 122 (11/01 0609) Resp:  [18-26] 20 (11/01 0609) BP: (82-138)/(51-100) 131/100 mmHg (11/01 0609) SpO2:  [95 %-100 %] 95 % (10/31 2030)  Intake/Output from previous day: 10/31 0701 - 11/01 0700 In: 250 [I.V.:250] Out: 2100 [Urine:2100] Intake/Output this shift: Total I/O In: -  Out: 150 [Urine:150]   Recent Labs  09/13/14 1515 09/14/14 0631 09/15/14 0550  HGB 11.6* 11.7* 10.0*    Recent Labs  09/14/14 0631 09/15/14 0550  WBC 6.6 7.4  RBC 3.98 3.39*  HCT 36.3 30.4*  PLT 267 287    Recent Labs  09/14/14 0631 09/15/14 0550  NA 143 142  K 3.5* 4.1  CL 104 103  CO2 27 25  BUN 15 12  CREATININE 0.39* 0.42*  GLUCOSE 92 101*  CALCIUM 9.2 9.1    Recent Labs  09/13/14 1515  INR 1.14    Neurologically intact Neurovascular intact Sensation intact distally Intact pulses distally Dorsiflexion/Plantar flexion intact Compartment soft  Scant drainage noted through dressing Negative homans bilaterally  Assessment/Plan: 2 Days Post-Op Procedure(s) (LRB): RIGHT HIP BIPOLAR ARTHROPLASTY  (Right) Advance diet Up with therapy  WBAT RLE Continue posterior hip precautions ABLA-stable.  Will continue to follow Dry dressing change prn   ANTON, M. LINDSEY 09/16/2014, 11:44 AM

## 2014-09-16 NOTE — Progress Notes (Signed)
Patient Demographics  Nicole Harvey, is a 80 y.o. female, DOB - 24-Aug-1928, KPT:[id number]  Admit date - 09/13/2014   Admitting Physician Kelvin Cellar, MD  Outpatient Primary MD for the patient is Leonard Downing, MD  LOS - 3   No chief complaint on file.       Subjective:   Nicole Harvey today is somnolent in bed, unable to provide review of systems or history. Appears comfortable.  Assessment & Plan    1. Intertrochanteric fracture of right hip - diagnosed by Dr. Mardelle Matte in the office where she presented with ongoing right hip discomfort, post open reduction internal fixation by Dr. Mardelle Matte on 09/14/2014. Tolerated the procedure well. Weightbearing as tolerated on the right leg per orthopedics, Lovenox for DVT prophylaxis 4 weeks.    2. Severe aortic stenosis. Valvular area 0.62 cm on echogram in May 2014. Strict intake and output monitor, avoid ACE/ARB or preload reduction, appreciate cardiology input for pre-operative evaluation.    3. Myelodysplastic syndrome. Chronic. Outpatient monitor with PCP. Chronically on prednisone. Monitor the need for stress dose steroids.     4. Advanced dementia. Currently in severe delirium. Supportive care. Avoid benzos, minimize narcotics, will require placement.     5. GERD. On PPI.     6. HTN - pain control, lopressor + as needed IV Hydralazine.      Code Status: Full  Family Communication: son, plain clearly about delirium, high aspiration and fall risk.  Disposition Plan:   SNF   Procedures R.ORIF on 09/14/2014 by Dr. Mardelle Matte   Consults cardiology, orthopedics   Medications  Scheduled Meds: . acetaminophen  1,000 mg Oral TID  . enoxaparin (LOVENOX) injection  30 mg Subcutaneous Q24H  . haloperidol lactate  2  mg Intravenous QHS  . haloperidol lactate  2 mg Intravenous Once  . methylPREDNISolone (SOLU-MEDROL) injection  20 mg Intravenous Q24H  . metoprolol tartrate  50 mg Oral BID   Continuous Infusions:   PRN Meds:.bisacodyl, haloperidol lactate, HYDROcodone-acetaminophen, morphine injection, [DISCONTINUED] ondansetron **OR** ondansetron (ZOFRAN) IV, polyethylene glycol, RESOURCE THICKENUP CLEAR  DVT Prophylaxis   - lovenox  Lab Results  Component Value Date   PLT 287 09/15/2014    Antibiotics     Anti-infectives    Start     Dose/Rate Route Frequency Ordered Stop   09/14/14 1900  ceFAZolin (ANCEF) IVPB 2 g/50 mL premix     2 g100 mL/hr over 30 Minutes Intravenous Every 6 hours 09/14/14 1806 09/15/14 0213   09/13/14 1114  ceFAZolin (ANCEF) IVPB 2 g/50 mL premix  Status:  Discontinued     2 g100 mL/hr over 30 Minutes Intravenous On call to O.R. 09/13/14 1114 09/14/14 1806          Objective:   Filed Vitals:   09/15/14 1700 09/15/14 1837 09/15/14 2030 09/16/14 0609  BP: 82/51 92/58 111/59 131/100  Pulse: 109 121 120 122  Temp:  97.7 F (36.5 C) 98.4 F (36.9 C) 98.1 F (36.7 C)  TempSrc:  Oral Oral Axillary  Resp: 19 18 18 20   Height:      Weight:      SpO2: 100% 96% 95%     Wt Readings from Last 3 Encounters:  09/15/14 44 kg (97 lb)  09/10/14 42.185 kg (93 lb)  05/24/14 44.453 kg (98 lb)     Intake/Output Summary (Last 24 hours) at 09/16/14 1307 Last data filed at 09/16/14 0919  Gross per 24 hour  Intake     50 ml  Output   1550 ml  Net  -1500 ml     Physical Exam  Awake , confused, Oriented X 0, No new F.N deficits, Normal affect [place].AT,PERRAL Supple Neck,No JVD, No cervical lymphadenopathy appriciated.  Symmetrical Chest wall movement, Good air movement bilaterally, CTAB RRR,No Gallops,Rubs or new Murmurs, No Parasternal Heave +ve B.Sounds, Abd Soft, No tenderness, No organomegaly appriciated, No rebound - guarding or rigidity. Foley in place. No  Cyanosis, Clubbing or edema, No new Rash or bruise      Data Review   Micro Results Recent Results (from the past 240 hour(s))  Surgical pcr screen     Status: None   Collection Time: 09/10/14  1:26 PM  Result Value Ref Range Status   MRSA, PCR NEGATIVE NEGATIVE Final   Staphylococcus aureus NEGATIVE NEGATIVE Final    Comment:        The Xpert SA Assay (FDA approved for NASAL specimens in patients over 103 years of age), is one component of a comprehensive surveillance program.  Test performance has been validated by [employer] for patients greater than or equal to 71 year old. It is not intended to diagnose infection nor to guide or monitor treatment.  Surgical pcr screen     Status: None   Collection Time: 09/14/14 12:35 AM  Result Value Ref Range Status   MRSA, PCR NEGATIVE NEGATIVE Final   Staphylococcus aureus NEGATIVE NEGATIVE Final    Comment:        The Xpert SA Assay (FDA approved for NASAL specimens in patients over 68 years of age), is one component of a comprehensive surveillance program.  Test performance has been validated by [employer] for patients greater than or equal to 28 year old. It is not intended to diagnose infection nor to guide or monitor treatment.  MRSA PCR Screening     Status: None   Collection Time: 09/14/14  6:47 PM  Result Value Ref Range Status   MRSA by PCR NEGATIVE NEGATIVE Final    Comment:        The GeneXpert MRSA Assay (FDA approved for NASAL specimens only), is one component of a comprehensive MRSA colonization surveillance program. It is not intended to diagnose MRSA infection nor to guide or monitor treatment for MRSA infections.    Radiology Reports Dg Chest 2 View  09/10/2014   CLINICAL DATA:  For right hip arthroplasty, history of heart murmur  EXAM: CHEST  2 VIEW  COMPARISON:  Chest x-ray of 10/24/2013  FINDINGS: Linear opacities are noted both in lung bases which may reflect linear atelectasis or possibly  scarring. No definite pneumonia or effusion is seen. The lungs are slightly hyperaerated. No mediastinal or hilar adenopathy is seen. The heart is mildly enlarged. The descending thoracic aorta is ectatic. The bones are diffusely osteopenic and multiple thoracic and lumbar compression deformities are noted which appear new compared to the lateral chest x-ray from December of 2014.  IMPRESSION: 1. Bibasilar linear atelectasis or scarring. 2. Osteopenia and multiple thoracic and lumbar vertebral compression deformities, new since December of 2014   Electronically Signed   By: Ivar Drape M.D.   On: 09/10/2014 13:39     CBC  Recent Labs Lab 09/10/14 1326 09/13/14 1515 09/14/14  0631 09/15/14 0550  WBC 8.3 6.5 6.6 7.4  HGB 11.4* 11.6* 11.7* 10.0*  HCT 36.1 35.6* 36.3 30.4*  PLT 288 275 267 287  MCV 92.6 89.9 91.2 89.7  MCH 29.2 29.3 29.4 29.5  MCHC 31.6 32.6 32.2 32.9  RDW 15.0 15.1 15.0 15.2  LYMPHSABS  --  0.8  --   --   MONOABS  --  0.6  --   --   EOSABS  --  0.1  --   --   BASOSABS  --  0.1  --   --     Chemistries   Recent Labs Lab 09/10/14 1326 09/13/14 1515 09/14/14 0631 09/15/14 0550  NA 142 144 143 142  K 3.8 3.5* 3.5* 4.1  CL 104 104 104 103  CO2 24 27 27 25   GLUCOSE 156* 134* 92 101*  BUN 14 12 15 12   CREATININE 0.49* 0.49* 0.39* 0.42*  CALCIUM 9.3 9.3 9.2 9.1  AST  --  15  --   --   ALT  --  18  --   --   ALKPHOS  --  337*  --   --   BILITOT  --  0.4  --   --    ------------------------------------------------------------------------------------------------------------------ estimated creatinine clearance is 36.4 mL/min (by C-G formula based on Cr of 0.42). ------------------------------------------------------------------------------------------------------------------ No results for input(s): HGBA1C in the last 72 hours. ------------------------------------------------------------------------------------------------------------------ No results for  input(s): CHOL, HDL, LDLCALC, TRIG, CHOLHDL, LDLDIRECT in the last 72 hours. ------------------------------------------------------------------------------------------------------------------ No results for input(s): TSH, T4TOTAL, T3FREE, THYROIDAB in the last 72 hours.  Invalid input(s): FREET3 ------------------------------------------------------------------------------------------------------------------ No results for input(s): VITAMINB12, FOLATE, FERRITIN, TIBC, IRON, RETICCTPCT in the last 72 hours.  Coagulation profile  Recent Labs Lab 09/13/14 1515  INR 1.14    No results for input(s): DDIMER in the last 72 hours.  Cardiac Enzymes  Recent Labs Lab 09/13/14 1515  TROPONINI <0.30   ------------------------------------------------------------------------------------------------------------------ Invalid input(s): POCBNP     Time Spent in minutes   35   Lorieann Argueta K M.D on 09/16/2014 at 1:07 PM  Between 7am to 7pm - Pager - [phone]  After 7pm go to [email or web address] - password [email or web address]  And look for the night coverage person covering for me after hours  [facility] Office  [phone]

## 2014-09-16 NOTE — Progress Notes (Signed)
Patient Nicole Harvey Nicole Harvey      DOB: [birth year]      EXH:[id number]   Palliative Medicine Team at [facility] Progress Note    Subjective: Received several PRN haldol doses and some IV morphine. Was doing a bit better this morning after being up most of night, but worse again this afternoon.  Restraints still needed. Son and sister at bedside attempting to control her agitation.       Filed Vitals:   09/16/14 0609  BP: 131/100  Pulse: 122  Temp: 98.1 F (36.7 C)  Resp: 20   Physical exam: GEN: confused, moderate distress 2/2 agitation HEENT: [place], dry mm, sclera anciteric CV: Tachy Skin: warm/dry Psych: severe confusion/agitation  CBC    Component Value Date/Time   WBC 7.4 09/15/2014 0550   WBC 6.5 11/28/2013 1105   RBC 3.39* 09/15/2014 0550   RBC 3.52* 11/28/2013 1105   RBC 3.66* 11/28/2013 1105   HGB 10.0* 09/15/2014 0550   HGB 11.1* 11/28/2013 1105   HCT 30.4* 09/15/2014 0550   HCT 33.7* 11/28/2013 1105   PLT 287 09/15/2014 0550   PLT 275 11/28/2013 1105   MCV 89.7 09/15/2014 0550   MCV 96 11/28/2013 1105   MCH 29.5 09/15/2014 0550   MCH 31.5 11/28/2013 1105   MCHC 32.9 09/15/2014 0550   MCHC 32.9 11/28/2013 1105   RDW 15.2 09/15/2014 0550   RDW 14.9 11/28/2013 1105   LYMPHSABS 0.8 09/13/2014 1515   LYMPHSABS 0.7* 11/28/2013 1105   MONOABS 0.6 09/13/2014 1515   EOSABS 0.1 09/13/2014 1515   EOSABS 0.0 11/28/2013 1105   BASOSABS 0.1 09/13/2014 1515   BASOSABS 0.1 11/28/2013 1105    CMP     Component Value Date/Time   NA 142 09/15/2014 0550   K 4.1 09/15/2014 0550   CL 103 09/15/2014 0550   CO2 25 09/15/2014 0550   GLUCOSE 101* 09/15/2014 0550   BUN 12 09/15/2014 0550   CREATININE 0.42* 09/15/2014 0550   CALCIUM 9.1 09/15/2014 0550   PROT 6.6 09/13/2014 1515   ALBUMIN 3.2* 09/13/2014 1515   AST 15 09/13/2014 1515   ALT 18 09/13/2014 1515   ALKPHOS 337* 09/13/2014 1515   BILITOT 0.4 09/13/2014 1515   GFRNONAA >90 09/15/2014 0550   GFRAA >90  09/15/2014 0550      Assessment and plan: 80 yo female with PMHx of dementia, severe AS who presented with hip fc. Post-op delirium.    1. Code Status: Full - son is checking on living will today.  Discussed some concerns about how she would do post intubation with mentation if she survived.  Will discuss further.   2. Goals of Care: See initial consult.  Discussed worrisome trajectory of her mental status.     3. Symptom Management:  1. Acute Delirium- several doses of PRN haldol with intermittent response. I will go ahead and schedule 2mg  TID during day and increase night time dose to 5mg . Will also add trazodone (she may not take with her agitation) 2. Hip Pain- couldn't take oral tylenol with agitation. Few doses of PRN IV morphine which is appropriate. Did not always respond. Will try IV tylenol.  3. Weight Loss- declining weight for past year. Was ~110lbs last year and now in 62's. Can consider mirtazapine based on how she does. Her steroids probably are the more effective medication for this though.   4. Psychosocial/Spiritual: Lived at home with husband who has Alzheimers. Paid caregivers in home. Rapid decline over past year.  Son Fritz Pickerel very supportive and assists both parents frequently.   Total time 30 minutes  >50% of time spent in counseling and coordination of care regarding above plan  Doran Clay D.O. Palliative Medicine Team at [facility]  Pager: [phone] Team Phone: [phone]

## 2014-09-16 NOTE — Progress Notes (Signed)
Speech Language Pathology Treatment: Dysphagia  Patient Details Name: Nicole Harvey MRN: [id number] DOB: 10/27/1928 Today's Date: 09/16/2014 Time: 8563-1497 SLP Time Calculation (min): 23 min  Assessment / Plan / Recommendation Clinical Impression  Skilled dysphagia intervention with son present.  Pt. Alert, confused but redirectable with mild verbal/tactile cues (improved from yesterday).  Self administered po's with tactile/verbal cueing with mildly delayed but functional oral manipulation and transit.  Likely compromised laryngeal protection due to consistent throat clearing following thin liquids.  Given confusion and need for cueing, recommend modified diet of Dys 2 and nectar thick liquids.  ST will follow up for liquid and diet upgrade at bedside.  If s/s aspiration persist with thin, would consider MBS.   HPI HPI: 80 y.o. female who presented initially to MD office with complaints of inability to walk. She was diagnosed with a femoral neck fracture, and ultimately brought into the hospital for preoperative optimization workup. The family elected for her to undergo hemiarthroplasty, despite severe aortic stenosis, failure to thrive, and advanced dementia.S/P RIGHT HIP BIPOLAR ARTHROPLASTY 10/30. Pre-op chest xray with Bibasilar linear atelectasis or scarring.    Pertinent Vitals Pain Assessment: No/denies pain  SLP Plan  Continue with current plan of care    Recommendations Diet recommendations: Dysphagia 2 (fine chop);Nectar-thick liquid Liquids provided via: Cup;Straw Medication Administration: Crushed with puree Supervision: Patient able to self feed;Full supervision/cueing for compensatory strategies              Plan: Continue with current plan of care    GO     Houston Siren 09/16/2014, 1:29 PM  [phone]

## 2014-09-16 NOTE — Progress Notes (Signed)
Patient remains agitated after IV Haldol. Patient unable to state pain level and location of pain but states she is having pain. Gave 2mg  IV Morphine.

## 2014-09-17 ENCOUNTER — Encounter (HOSPITAL_COMMUNITY): Payer: Self-pay | Admitting: Orthopedic Surgery

## 2014-09-17 DIAGNOSIS — I350 Nonrheumatic aortic (valve) stenosis: Secondary | ICD-10-CM

## 2014-09-17 DIAGNOSIS — S72001A Fracture of unspecified part of neck of right femur, initial encounter for closed fracture: Secondary | ICD-10-CM

## 2014-09-17 DIAGNOSIS — G8918 Other acute postprocedural pain: Secondary | ICD-10-CM | POA: Insufficient documentation

## 2014-09-17 MED ORDER — ENSURE PUDDING PO PUDG
1.0000 | Freq: Three times a day (TID) | ORAL | Status: DC
  Administered 2014-09-17: 1 via ORAL

## 2014-09-17 MED ORDER — PREDNISONE 10 MG PO TABS
10.0000 mg | ORAL_TABLET | Freq: Every day | ORAL | Status: DC
  Administered 2014-09-18: 10 mg via ORAL
  Filled 2014-09-17 (×2): qty 1

## 2014-09-17 MED ORDER — HALOPERIDOL LACTATE 5 MG/ML IJ SOLN
2.0000 mg | Freq: Every day | INTRAMUSCULAR | Status: DC
  Filled 2014-09-17: qty 0.4

## 2014-09-17 MED ORDER — PANTOPRAZOLE SODIUM 40 MG PO TBEC: 40.0000 mg | DELAYED_RELEASE_TABLET | Freq: Every day | ORAL | Status: DC

## 2014-09-17 MED ORDER — ACETAMINOPHEN 160 MG/5ML PO SOLN
1000.0000 mg | Freq: Three times a day (TID) | ORAL | Status: DC
  Filled 2014-09-17 (×5): qty 40

## 2014-09-17 NOTE — Progress Notes (Signed)
Patient unable to spontaneously void. Patient was bladder scanned at 1530 and 456mL was recorded. In and out cath was performed at 1615 and 317mL resulted. Will continue to monitor patients output over the next 6 hours.

## 2014-09-17 NOTE — Progress Notes (Signed)
Patient [id number] Nicole Harvey      DOB: 10/18/1928      GYI:[id number]   Palliative Medicine Team at [facility] Progress Note    Subjective: Resting more comfortably today. Received several doses of haldol but unfortunately evening nurse did not give some scheduled doses.      Filed Vitals:   09/17/14 0513  BP: 98/82  Pulse: 78  Temp: 98.4 F (36.9 C)  Resp: 16   Physical exam: GEN: resting more comfortably, NAD CV: RRR LUNGS: CTAB Abd; soft, ND Ext: warm, no edema Skin: warm/dry  CBC    Component Value Date/Time   WBC 7.4 09/15/2014 0550   WBC 6.5 11/28/2013 1105   RBC 3.39* 09/15/2014 0550   RBC 3.52* 11/28/2013 1105   RBC 3.66* 11/28/2013 1105   HGB 10.0* 09/15/2014 0550   HGB 11.1* 11/28/2013 1105   HCT 30.4* 09/15/2014 0550   HCT 33.7* 11/28/2013 1105   PLT 287 09/15/2014 0550   PLT 275 11/28/2013 1105   MCV 89.7 09/15/2014 0550   MCV 96 11/28/2013 1105   MCH 29.5 09/15/2014 0550   MCH 31.5 11/28/2013 1105   MCHC 32.9 09/15/2014 0550   MCHC 32.9 11/28/2013 1105   RDW 15.2 09/15/2014 0550   RDW 14.9 11/28/2013 1105   LYMPHSABS 0.8 09/13/2014 1515   LYMPHSABS 0.7* 11/28/2013 1105   MONOABS 0.6 09/13/2014 1515   EOSABS 0.1 09/13/2014 1515   EOSABS 0.0 11/28/2013 1105   BASOSABS 0.1 09/13/2014 1515   BASOSABS 0.1 11/28/2013 1105    CMP     Component Value Date/Time   NA 142 09/15/2014 0550   K 4.1 09/15/2014 0550   CL 103 09/15/2014 0550   CO2 25 09/15/2014 0550   GLUCOSE 101* 09/15/2014 0550   BUN 12 09/15/2014 0550   CREATININE 0.42* 09/15/2014 0550   CALCIUM 9.1 09/15/2014 0550   PROT 6.6 09/13/2014 1515   ALBUMIN 3.2* 09/13/2014 1515   AST 15 09/13/2014 1515   ALT 18 09/13/2014 1515   ALKPHOS 337* 09/13/2014 1515   BILITOT 0.4 09/13/2014 1515   GFRNONAA >90 09/15/2014 0550   GFRAA >90 09/15/2014 0550     Assessment and plan: 80 yo female with PMHx of dementia, severe AS who presented with hip fc. Post-op delirium.  1. Code  Status: DNR today  2. Goals of Care: See initial consult. Discussed worrisome trajectory of her mental status. Will continue to monitor.     3. Symptom Management:  1. Acute Delirium- seems a bit better.  Agree with Dr Sloan Leiter that we should switch to oral when able and try to back off on scheduled daytime doses if she continues to have agitation controlled.  2. Hip Pain- switch IV to oral tylenol. PRN morphine available as well. Difficult for her to describe pain. Appears more comfortable today.  3. Weight Loss- declining weight for past year. Was ~110lbs last year and now in 11's. Can consider mirtazapine based on how she does. Her steroids probably are the more effective medication for this though.   4. Psychosocial/Spiritual: Lived at home with husband who has Alzheimers. Paid caregivers in home. Rapid decline over past year. Son Fritz Pickerel very supportive and assists both parents frequently.   Doran Clay D.O. Palliative Medicine Team at [facility]  Pager: [phone] Team Phone: [phone]

## 2014-09-17 NOTE — Progress Notes (Signed)
Subjective: She says she is sleepy.    Objective: Vital signs in last 24 hours: Temp:  [98 F (36.7 C)-98.4 F (36.9 C)] 98.4 F (36.9 C) (11/02 0513) Pulse Rate:  [78-108] 78 (11/02 0513) Resp:  [16-18] 16 (11/02 0513) BP: (98-103)/(66-82) 98/82 mmHg (11/02 0513) SpO2:  [94 %-97 %] 97 % (11/02 0513) Last BM Date: 09/13/14  Intake/Output from previous day: 11/01 0701 - 11/02 0700 In: 52 [P.O.:120; IV Piggyback:300] Out: 750 [Urine:750] Intake/Output this shift: Total I/O In: 120 [P.O.:120] Out: -   Medications Current Facility-Administered Medications  Medication Dose Route Frequency Provider Last Rate Last Dose  . bisacodyl (DULCOLAX) suppository 10 mg  10 mg Rectal Daily PRN Johnny Bridge, MD      . enoxaparin (LOVENOX) injection 30 mg  30 mg Subcutaneous Q24H Johnny Bridge, MD   30 mg at 09/17/14 0900  . haloperidol lactate (HALDOL) injection 2 mg  2 mg Intravenous Q4H PRN Thurnell Lose, MD   2 mg at 09/16/14 1309  . haloperidol lactate (HALDOL) injection 2 mg  2 mg Intravenous Once Thurnell Lose, MD   2 mg at 09/15/14 2235  . haloperidol lactate (HALDOL) injection 2 mg  2 mg Intravenous TID BM Brock Ra Lampkin, DO   2 mg at 09/17/14 0037  . haloperidol lactate (HALDOL) injection 5 mg  5 mg Intravenous QHS Brock Ra Lampkin, DO   5 mg at 09/16/14 2200  . HYDROcodone-acetaminophen (NORCO/VICODIN) 5-325 MG per tablet 1 tablet  1 tablet Oral Q4H PRN Thurnell Lose, MD   1 tablet at 09/17/14 0153  . methylPREDNISolone sodium succinate (SOLU-MEDROL) 40 mg/mL injection 20 mg  20 mg Intravenous Q24H Thurnell Lose, MD   20 mg at 09/16/14 1426  . metoprolol (LOPRESSOR) tablet 50 mg  50 mg Oral BID Thurnell Lose, MD   50 mg at 09/17/14 0951  . morphine 2 MG/ML injection 2 mg  2 mg Intravenous Q4H PRN Thurnell Lose, MD   2 mg at 09/17/14 0004  . ondansetron (ZOFRAN) injection 4 mg  4 mg Intravenous Q6H PRN Johnny Bridge, MD      . polyethylene glycol  (MIRALAX / GLYCOLAX) packet 17 g  17 g Oral Daily PRN Johnny Bridge, MD      . RESOURCE THICKENUP CLEAR   Oral PRN Thurnell Lose, MD      . traZODone (DESYREL) tablet 50 mg  50 mg Oral QHS Brock Ra Lampkin, DO   50 mg at 09/16/14 2030    PE: General appearance: alert, cooperative and no distress Lungs: clear to auscultation bilaterally Heart: regular rate and rhythm and 2/6 sys MM Abdomen: +BS, nontender Extremities: No LEE Pulses: 2+ and symmetric Skin: Warm and dry Neurologic: Grossly normal  Lab Results:   Recent Labs  09/15/14 0550  WBC 7.4  HGB 10.0*  HCT 30.4*  PLT 287   BMET  Recent Labs  09/15/14 0550  NA 142  K 4.1  CL 103  CO2 25  GLUCOSE 101*  BUN 12  CREATININE 0.42*  CALCIUM 9.1   Echocardiogram Study Conclusions  - Left ventricle: The cavity size was normal. Wall thickness was increased in a pattern of mild LVH. The estimated ejection fraction was 60%. Wall motion was normal; there were no regional wall motion abnormalities. - Aortic valve: THERE IS SEVERE AORTIC STENOSIS. The gradients are higher than the prior study, Mean gradient (S): 50 mm Hg. Peak  gradient (S): 78 mm Hg. - Right ventricle: The cavity size was normal. Systolic function was normal. - Pulmonary arteries: PA peak pressure: 37 mm Hg (S).  Assessment/Plan   1. S/p hip fracture/repair, POD#3. 2. Critical Aortic stenosis  Echo shows mean and peak gradients: 50/55mmHg. Not a surgical candidate.   EF 60% with normal wall motion.   3. Chronic diastolic heart failure  Appears euvolemic to dry.  Mildly hypotensive.  Metoprolol 50mg  BID.  Net fluids:  -3.5L ? Accuracy. 4. Postop pain:  Reports no pain currently.   5. Advanced age/dementia:  Palliative med following   Stable from cardiac standpoint.      LOS: 4 days    HAGER, BRYAN PA-C 09/17/2014 10:34 AM  Given age and dementia not a candidate for intervention to aortic valve.  No CHF and cardiac Rhythm  stable at this time  AS murmur on exam  Jenkins Rouge

## 2014-09-17 NOTE — Progress Notes (Signed)
PATIENT DETAILS Name: Nicole Harvey Age: 80 y.o. Sex: female Date of Birth: Feb 21, 1928 Admit Date: 09/13/2014 Admitting Physician Kelvin Cellar, MD WUJ:[id number],NWGNFA Danne Baxter, MD  Subjective: Pleasantly confused this am.  Assessment/Plan: Principal Problem:   Fracture of femoral neck, right:Underwent open reduction internal fixation on 10/30. Delirious post operatively,slowly improving with IV Haldol. Weightbearing as tolerated on the right leg per orthopedics, Lovenox for DVT prophylaxis 4 weeks.Spoke with son on 11/2-tentatively SNF on 11/3.  Active Problems:  Acute Delirium:has severe Dementia, better with IV Haldol, will transition to oral Haldol if clinical improvement continues. Difficult situation, minimize narcotics as much as possible. Suspect will continue to be delirious as long as in unfamiliar surroundings and in pain.    Myelodysplastic syndrome, low grade:chronically maintained on prednisone, placed on IV Solumedrol on admission, will transition back to prednisone.Monitor CBC periodically  Severe aortic stenosis: Valvular area 0.62 cm on echogram in May 2014. Strict intake and output monitor, avoid ACE/ARB or preload reduction, appreciate cardiology input for pre-operative evaluation.  Protein-calorie malnutrition, severe:nutrition eval  GERD: On PPI.  Disposition: Remain inpatient  Antibiotics:  IV Cefazolin X 1 10/29  DVT Prophylaxis: Prophylactic Lovenox   Code Status:  DNR-conformed with son-Larry over the phone  Family Communication Larry-son-via the phone  Procedures:  Open reduction internal fixation on 10/30  CONSULTS:  orthopedic surgery and Palliative Care    MEDICATIONS: Scheduled Meds: . enoxaparin (LOVENOX) injection  30 mg Subcutaneous Q24H  . haloperidol lactate  2 mg Intravenous Once  . haloperidol lactate  2 mg Intravenous TID BM  . haloperidol lactate  5 mg Intravenous QHS  . methylPREDNISolone (SOLU-MEDROL)  injection  20 mg Intravenous Q24H  . metoprolol tartrate  50 mg Oral BID  . traZODone  50 mg Oral QHS   Continuous Infusions:  PRN Meds:.bisacodyl, haloperidol lactate, HYDROcodone-acetaminophen, morphine injection, [DISCONTINUED] ondansetron **OR** ondansetron (ZOFRAN) IV, polyethylene glycol, RESOURCE THICKENUP CLEAR  Antibiotics: Anti-infectives    Start     Dose/Rate Route Frequency Ordered Stop   09/14/14 1900  ceFAZolin (ANCEF) IVPB 2 g/50 mL premix     2 g100 mL/hr over 30 Minutes Intravenous Every 6 hours 09/14/14 1806 09/15/14 0213   09/13/14 1114  ceFAZolin (ANCEF) IVPB 2 g/50 mL premix  Status:  Discontinued     2 g100 mL/hr over 30 Minutes Intravenous On call to O.R. 09/13/14 1114 09/14/14 1806       PHYSICAL EXAM: Vital signs in last 24 hours: Filed Vitals:   09/16/14 1444 09/16/14 1600 09/16/14 1923 09/17/14 0513  BP: 99/73  103/66 98/82  Pulse: 108  93 78  Temp: 98 F (36.7 C)  98.1 F (36.7 C) 98.4 F (36.9 C)  TempSrc: Oral  Oral Oral  Resp: 16 16 16 16   Height:      Weight:      SpO2: 94% 95% 96% 97%    Weight change:  Filed Weights   09/13/14 1111 09/15/14 0412  Weight: 42.185 kg (93 lb) 44 kg (97 lb)   Body mass index is 17.19 kg/(m^2).   Gen Exam: Awake, pleasantly confused.  Neck: Supple, No JVD.   Chest: B/L Clear.   CVS: S1 S2 Regular, +3/6 syst mumur Abdomen: soft, BS +, non tender, non distended.  Extremities: no edema, lower extremities warm to touch. Neurologic: Non Focal-seems to be moving all 4 ext   Skin: No Rash.   Wounds: N/A.    Intake/Output from previous day:  Intake/Output Summary (Last 24 hours) at 09/17/14 1102 Last data filed at 09/17/14 0900  Gross per 24 hour  Intake    540 ml  Output    600 ml  Net    -60 ml     LAB RESULTS: CBC  Recent Labs Lab 09/10/14 1326 09/13/14 1515 09/14/14 0631 09/15/14 0550  WBC 8.3 6.5 6.6 7.4  HGB 11.4* 11.6* 11.7* 10.0*  HCT 36.1 35.6* 36.3 30.4*  PLT 288 275 267 287    MCV 92.6 89.9 91.2 89.7  MCH 29.2 29.3 29.4 29.5  MCHC 31.6 32.6 32.2 32.9  RDW 15.0 15.1 15.0 15.2  LYMPHSABS  --  0.8  --   --   MONOABS  --  0.6  --   --   EOSABS  --  0.1  --   --   BASOSABS  --  0.1  --   --     Chemistries   Recent Labs Lab 09/10/14 1326 09/13/14 1515 09/14/14 0631 09/15/14 0550  NA 142 144 143 142  K 3.8 3.5* 3.5* 4.1  CL 104 104 104 103  CO2 24 27 27 25   GLUCOSE 156* 134* 92 101*  BUN 14 12 15 12   CREATININE 0.49* 0.49* 0.39* 0.42*  CALCIUM 9.3 9.3 9.2 9.1    CBG: No results for input(s): GLUCAP in the last 168 hours.  GFR Estimated Creatinine Clearance: 36.4 mL/min (by C-G formula based on Cr of 0.42).  Coagulation profile  Recent Labs Lab 09/13/14 1515  INR 1.14    Cardiac Enzymes  Recent Labs Lab 09/13/14 1515  TROPONINI <0.30    Invalid input(s): POCBNP No results for input(s): DDIMER in the last 72 hours. No results for input(s): HGBA1C in the last 72 hours. No results for input(s): CHOL, HDL, LDLCALC, TRIG, CHOLHDL, LDLDIRECT in the last 72 hours. No results for input(s): TSH, T4TOTAL, T3FREE, THYROIDAB in the last 72 hours.  Invalid input(s): FREET3 No results for input(s): VITAMINB12, FOLATE, FERRITIN, TIBC, IRON, RETICCTPCT in the last 72 hours. No results for input(s): LIPASE, AMYLASE in the last 72 hours.  Urine Studies No results for input(s): UHGB, CRYS in the last 72 hours.  Invalid input(s): UACOL, UAPR, USPG, UPH, UTP, UGL, UKET, UBIL, UNIT, UROB, ULEU, UEPI, UWBC, URBC, UBAC, CAST, UCOM, BILUA  MICROBIOLOGY: Recent Results (from the past 240 hour(s))  Surgical pcr screen     Status: None   Collection Time: 09/10/14  1:26 PM  Result Value Ref Range Status   MRSA, PCR NEGATIVE NEGATIVE Final   Staphylococcus aureus NEGATIVE NEGATIVE Final    Comment:        The Xpert SA Assay (FDA approved for NASAL specimens in patients over 70 years of age), is one component of a comprehensive  surveillance program.  Test performance has been validated by [employer] for patients greater than or equal to 57 year old. It is not intended to diagnose infection nor to guide or monitor treatment.  Surgical pcr screen     Status: None   Collection Time: 09/14/14 12:35 AM  Result Value Ref Range Status   MRSA, PCR NEGATIVE NEGATIVE Final   Staphylococcus aureus NEGATIVE NEGATIVE Final    Comment:        The Xpert SA Assay (FDA approved for NASAL specimens in patients over 71 years of age), is one component of a comprehensive surveillance program.  Test performance has been validated by [facility] for patients greater than or equal to 1  year old. It is not intended to diagnose infection nor to guide or monitor treatment.  MRSA PCR Screening     Status: None   Collection Time: 09/14/14  6:47 PM  Result Value Ref Range Status   MRSA by PCR NEGATIVE NEGATIVE Final    Comment:        The GeneXpert MRSA Assay (FDA approved for NASAL specimens only), is one component of a comprehensive MRSA colonization surveillance program. It is not intended to diagnose MRSA infection nor to guide or monitor treatment for MRSA infections.    RADIOLOGY STUDIES/RESULTS: Dg Chest 2 View  09/10/2014   CLINICAL DATA:  For right hip arthroplasty, history of heart murmur  EXAM: CHEST  2 VIEW  COMPARISON:  Chest x-ray of 10/24/2013  FINDINGS: Linear opacities are noted both in lung bases which may reflect linear atelectasis or possibly scarring. No definite pneumonia or effusion is seen. The lungs are slightly hyperaerated. No mediastinal or hilar adenopathy is seen. The heart is mildly enlarged. The descending thoracic aorta is ectatic. The bones are diffusely osteopenic and multiple thoracic and lumbar compression deformities are noted which appear new compared to the lateral chest x-ray from December of 2014.  IMPRESSION: 1. Bibasilar linear atelectasis or scarring. 2. Osteopenia and multiple  thoracic and lumbar vertebral compression deformities, new since December of 2014   Electronically Signed   By: Ivar Drape M.D.   On: 09/10/2014 13:39   Pelvis Portable  09/14/2014   CLINICAL DATA:  Right hip replacement.  EXAM: PORTABLE PELVIS 1-2 VIEWS  COMPARISON:  None.  FINDINGS: Right hip replacement with good anatomic alignment. No acute abnormality. Diffuse osteopenia.  IMPRESSION: Right hip replacement with good anatomic alignment.   Electronically Signed   By: Marcello Moores  Register   On: 09/14/2014 17:26   Hip Portable 1 View Right  09/16/2014   CLINICAL DATA:  Right hip fracture.  Postop from hip arthroplasty.  EXAM: PORTABLE RIGHT HIP - 1 VIEW  COMPARISON:  None.  FINDINGS: Right hip prosthesis is seen in expected position. No evidence of fracture or dislocation.  IMPRESSION: Right hip prosthesis in appropriate position. No fracture or other acute findings identified.   Electronically Signed   By: Earle Gell M.D.   On: 09/16/2014 10:46    Oren Binet, MD  [facility] Pager:[phone]  If 7PM-7AM, please contact night-coverage [email or web address] Password [email or web address] 09/17/2014, 11:02 AM   LOS: 4 days

## 2014-09-17 NOTE — Clinical Social Work Placement (Addendum)
Clinical Social Work Department CLINICAL SOCIAL WORK PLACEMENT NOTE 09/17/2014  Patient:  Nicole Harvey, Nicole Harvey  Account Number:  [id number] Admit date:  09/13/2014  Clinical Social Worker:  Delrae Sawyers  Date/time:  09/17/2014 02:57 PM  Clinical Social Work is seeking post-discharge placement for this patient at the following level of care:   [place]   (*CSW will update this form in Epic as items are completed)   09/17/2014  Patient/family provided with [place] Department of Clinical Social Work's list of facilities offering this level of care within the geographic area requested by the patient (or if unable, by the patient's family).  09/17/2014  Patient/family informed of their freedom to choose among providers that offer the needed level of care, that participate in Medicare, Medicaid or managed care program needed by the patient, have an available bed and are willing to accept the patient.  09/17/2014  Patient/family informed of [facility]' ownership interest in [facility], as well as of the fact that they are under no obligation to receive care at this facility.  PASARR submitted to EDS on 09/17/2014 PASARR number received on 09/17/2014  FL2 transmitted to all facilities in geographic area requested by pt/family on  09/17/2014 FL2 transmitted to all facilities within larger geographic area on 09/17/2014  Patient informed that his/her managed care company has contracts with or will negotiate with  certain facilities, including the following:     Patient/family informed of bed offers received:  09/18/2014 Patient chooses bed at  [facility] Physician recommends and patient chooses bed at    Patient to be transferred to  [facility] on  09/18/2014 Patient to be transferred to facility by [facility] Patient and family notified of transfer on 09/18/2014 Name of family member notified:  Pt's son, Fritz Pickerel,  updated regarding discharge.  The following physician request were entered in Epic:   Additional Comments:  [place] ([phone]) Licensed Clinical Social Worker Orthopedics [phone]) and Surgical [phone])

## 2014-09-17 NOTE — Progress Notes (Signed)
Physical Therapy Treatment Patient Details Name: Nicole Harvey MRN: [id number] DOB: 01-31-1928 Today's Date: 09/17/2014    History of Present Illness Pt seen for progressive weakness of right leg and decrease in ambulation found to have subacute right displaced femoral neck fracture and underwent a RTHA. PMHx:dementia    PT Comments    Pt moving better today and was able to ambulate 5 feet with RW and MOD A.  Safety sitter in room with patient.  Follow Up Recommendations  SNF;Supervision/Assistance - 24 hour     Equipment Recommendations  None recommended by PT    Recommendations for Other Services       Precautions / Restrictions Precautions Precautions: Fall;Posterior Hip Restrictions Weight Bearing Restrictions: No RLE Weight Bearing: Weight bearing as tolerated    Mobility  Bed Mobility Overal bed mobility: Needs Assistance;+2 for physical assistance Bed Mobility: Supine to Sit     Supine to sit: +2 for physical assistance;Max assist     General bed mobility comments: Pt attempting to move legs to edge of bed, but needed +2 to get upright for legs and trunk  Transfers Overall transfer level: Needs assistance Equipment used: Rolling walker (2 wheeled);2 person hand held assist Transfers: Sit to/from [facility] Sit to Stand: Min guard Stand pivot transfers: +2 physical assistance;Min assist       General transfer comment: SPT to recliner with HHA of 2.  Then brought RW in front of pt and stood with CGA.  Ambulation/Gait Ambulation/Gait assistance: Mod assist Ambulation Distance (Feet): 5 Feet Assistive device: Rolling walker (2 wheeled) Gait Pattern/deviations: Step-to pattern;Decreased step length - left;Decreased step length - right;Shuffle Gait velocity: decreased   General Gait Details: slow shuffeling gait with RW   Stairs            Wheelchair Mobility    Modified Rankin (Stroke Patients Only)       Balance  Overall balance assessment: Needs assistance;History of Falls Sitting-balance support: Feet supported;Bilateral upper extremity supported Sitting balance-Leahy Scale: Poor   Postural control: Posterior lean Standing balance support: Bilateral upper extremity supported Standing balance-Leahy Scale: Poor                      Cognition Arousal/Alertness: Awake/alert Behavior During Therapy: WFL for tasks assessed/performed Overall Cognitive Status: History of cognitive impairments - at baseline         Following Commands: Follows one step commands with increased time Safety/Judgement: Decreased awareness of safety;Decreased awareness of deficits (one one occassion while we were in the room she started to get up by herself)          Exercises General Exercises - Lower Extremity Ankle Circles/Pumps: AROM;Both;10 reps;Seated Long Arc Quad: AROM;Both;10 reps;Seated    General Comments        Pertinent Vitals/Pain Pain Assessment: Faces Faces Pain Scale: No hurt    Home Living Family/patient expects to be discharged to:: Private residence Living Arrangements: Children (son is caregiver per chart) Available Help at Discharge: Family (not sure how many hours per day)                Prior Function        Comments: unsure and no family present   PT Goals (current goals can now be found in the care plan section) Acute Rehab PT Goals Patient Stated Goal: Pt agreeable to work with PT PT Goal Formulation: With patient Time For Goal Achievement: 09/29/14 Potential to Achieve Goals: Good Progress towards PT  goals: Progressing toward goals    Frequency  Min 5X/week    PT Plan Current plan remains appropriate    Co-evaluation PT/OT/SLP Co-Evaluation/Treatment: Yes Reason for Co-Treatment: For patient/therapist safety;Complexity of the patient's impairments (multi-system involvement) PT goals addressed during session: Mobility/safety with mobility;Proper use of  DME OT goals addressed during session: ADL's and self-care;Strengthening/ROM     End of Session Equipment Utilized During Treatment: Gait belt Activity Tolerance: Patient tolerated treatment well Patient left: in chair;with nursing/sitter in room     Time: 6381-7711 PT Time Calculation (min): 23 min  Charges:  $Therapeutic Activity: 8-22 mins                    G Codes:      Nicole Harvey 09/17/2014, 12:08 PM

## 2014-09-17 NOTE — Clinical Social Work Psychosocial (Signed)
Clinical Social Work Department BRIEF PSYCHOSOCIAL ASSESSMENT 09/17/2014  Patient:  Nicole Harvey, Nicole Harvey     Account Number:  [id number]     Admit date:  09/13/2014  Clinical Social Worker:  Delrae Sawyers  Date/Time:  09/17/2014 02:53 PM  Referred by:  Physician  Date Referred:  09/17/2014 Referred for  SNF Placement   Other Referral:   none.   Interview type:  Family Other interview type:   CSW spoke with pt's son, Nicole Harvey [phone]).    PSYCHOSOCIAL DATA Living Status:  HUSBAND Admitted from facility:   Level of care:   Primary support name:  Nicole Harvey Primary support relationship to patient:  CHILD, ADULT Degree of support available:   Strong support system.    CURRENT CONCERNS Current Concerns  Post-Acute Placement   Other Concerns:   none.    SOCIAL WORK ASSESSMENT / PLAN CSW received referral for possible SNF placement at time of discharge. CSW spoke with pt's son, Nicole Harvey, regarding SNF placement at time of discharge. Pt's son stated he is agreeable to SNF placement for pt. Pt's son informed CSW pt is from home with spouse, where both pt and pt's husband have 24/7 caregiving from [facility] health agency. Pt's son stated pt's family anticipates pt to return home once completing short-term rehabilitation. CSW to continue to follow and assist with discharge planning needs.   Assessment/plan status:  Psychosocial Support/Ongoing Assessment of Needs Other assessment/ plan:   none.   Information/referral to community resources:   [place] SNF bed offers.    PATIENT'S/FAMILY'S RESPONSE TO PLAN OF CARE: Pt's son understanding and agreeable to CSW plan of care. Pt's son expressed no further questions or concerns at this time.       Lubertha Sayres, [place] ([phone]) Licensed Clinical Social Worker Orthopedics [phone]) and Surgical [phone])

## 2014-09-17 NOTE — Evaluation (Signed)
Occupational Therapy Evaluation and Discharge Patient Details Name: Nicole Harvey MRN: [id number] DOB: 10/17/1928 Today's Date: 09/17/2014    History of Present Illness Pt seen for progressive weakness of right leg and decrease in ambulation found to have subacute right displaced femoral neck fracture and underwent a RTHA. PMHx:dementia   Clinical Impression   This 80 yo female admitted and underwent above presents to acute OT with decreased cognition, decreased mobility, decreased balance, and posterior hip precautions all affecting her ability to care for herself. She will benefit from continued OT at SNF, acute OT will D/C.    Follow Up Recommendations  SNF    Equipment Recommendations   (TBD at next venue; if home with 24/7 A then 3n1 )       Precautions / Restrictions Precautions Precautions: Fall;Posterior Hip Restrictions Weight Bearing Restrictions: No RLE Weight Bearing: Weight bearing as tolerated      Mobility Bed Mobility Overal bed mobility: Needs Assistance;+2 for physical assistance Bed Mobility: Supine to Sit     Supine to sit: +2 for physical assistance;Max assist        Transfers Overall transfer level: Needs assistance Equipment used:  (1st attempt Bil HHA; 2nd attempt RW) Transfers: Sit to/from Stand Sit to Stand: +2 safety/equipment;Min assist              Balance Overall balance assessment: Needs assistance;History of Falls Sitting-balance support: Bilateral upper extremity supported;Feet supported Sitting balance-Leahy Scale: Poor (verging on fair once positioned)   Postural control: Posterior lean Standing balance support: Bilateral upper extremity supported Standing balance-Leahy Scale: Poor                              ADL Overall ADL's : Needs assistance/impaired Eating/Feeding: Minimal assistance;Sitting   Grooming: Brushing hair;Minimal assistance;Sitting   Upper Body Bathing: Minimal assitance;Sitting    Lower Body Bathing: Moderate assistance (with Min A sit<>stand)   Upper Body Dressing : Minimal assistance;Sitting   Lower Body Dressing: Maximal assistance (with min A sit<>stand)   Toilet Transfer: Minimal assistance;+2 for safety/equipment (bed>recliner going to pt's left with Bil HHA; then 8 steps forward with RW)   Toileting- Clothing Manipulation and Hygiene: Moderate assistance;Independent (with min A sit<>stand)                         Pertinent Vitals/Pain Pain Assessment: No/denies pain (and no signs/symptoms of pain)     Hand Dominance Right   Extremity/Trunk Assessment Upper Extremity Assessment Upper Extremity Assessment: Overall WFL for tasks assessed (OA in hands)           Communication Communication Communication:  (talks mainly in a very low tone)   Cognition Arousal/Alertness: Awake/alert Behavior During Therapy: WFL for tasks assessed/performed Overall Cognitive Status: History of cognitive impairments - at baseline         Following Commands: Follows one step commands with increased time Safety/Judgement: Decreased awareness of safety;Decreased awareness of deficits (one one occassion while we were in the room she started to get up by herself)                    Home Living Family/patient expects to be discharged to:: Private residence Living Arrangements: Children (son is caregiver per chart) Available Help at Discharge: Family (not sure how many hours per day)  Prior Functioning/Environment          Comments: unsure and no family present    OT Diagnosis: Generalized weakness;Cognitive deficits   OT Problem List: Decreased strength;Decreased range of motion;Impaired balance (sitting and/or standing);Decreased cognition;Decreased knowledge of precautions;Decreased knowledge of use of DME or AE      OT Goals(Current goals can be found in the care plan section) Acute Rehab OT  Goals Patient Stated Goal: to get up in chair (pt agreeable)  OT Frequency:             Co-evaluation PT/OT/SLP Co-Evaluation/Treatment: Yes Reason for Co-Treatment: Complexity of the patient's impairments (multi-system involvement);For patient/therapist safety   OT goals addressed during session: ADL's and self-care;Strengthening/ROM      End of Session Equipment Utilized During Treatment: Gait belt;Rolling walker  Activity Tolerance: Patient tolerated treatment well Patient left: in chair;with call bell/phone within reach;with nursing/sitter in room (sitter)   Time: 6681-5947 OT Time Calculation (min): 25 min Charges:  OT General Charges $OT Visit: 1 Procedure OT Evaluation $Initial OT Evaluation Tier I: 1 Procedure OT Treatments $Self Care/Home Management : 8-22 mins  Almon Register [phone] 09/17/2014, 10:38 AM

## 2014-09-17 NOTE — Progress Notes (Signed)
Speech Language Pathology Treatment: Dysphagia  Patient Details Name: Nicole Harvey MRN: [id number] DOB: [birth year] Today's Date: 09/17/2014 Time: 5364-6803 SLP Time Calculation (min): 11 min  Assessment / Plan / Recommendation Clinical Impression  Pt was seen with limited POs given current lethargy and participation level. SLP provided repositioning, environmental adjustments, and Max multimodal cueing for increased arousal and safety. Pt did not show overt signs of aspiration with nectar thick liquids, which she consumed by spoonful with Total A. After a few sips, she began to decline all other POs. NT reports that patient consumed lunch and breakfast well. Will continue to follow.   HPI HPI: 80 y.o. female who presented initially to MD office with complaints of inability to walk. She was diagnosed with a femoral neck fracture, and ultimately brought into the hospital for preoperative optimization workup. The family elected for her to undergo hemiarthroplasty, despite severe aortic stenosis, failure to thrive, and advanced dementia.S/P RIGHT HIP BIPOLAR ARTHROPLASTY 10/30. Pre-op chest xray with Bibasilar linear atelectasis or scarring.    Pertinent Vitals Pain Assessment: Faces Faces Pain Scale: No hurt  SLP Plan  Continue with current plan of care    Recommendations Diet recommendations: Dysphagia 2 (fine chop);Nectar-thick liquid Liquids provided via: Cup Medication Administration: Crushed with puree Supervision: Patient able to self feed;Full supervision/cueing for compensatory strategies Compensations: Slow rate;Small sips/bites;Check for pocketing Postural Changes and/or Swallow Maneuvers: Seated upright 90 degrees              Oral Care Recommendations: Oral care BID Follow up Recommendations: Skilled Nursing facility Plan: Continue with current plan of care    GO      Nicole Harvey, M.A. CCC-SLP [phone]  Nicole Harvey 09/17/2014, 3:19 PM

## 2014-09-17 NOTE — Progress Notes (Signed)
NUTRITION FOLLOW UP  Pt meets criteria for SEVERE MALNUTRITION in the context of chronic illness as evidenced by a 15% weight loss in 10 months and severe fat and muscle mass loss.  DOCUMENTATION CODES Per approved criteria  -Severe malnutrition in the context of chronic illness -Underweight   INTERVENTION: Provide Ensure Pudding po TID, each supplement provides 170 kcal and 4 grams of protein.  Provide Magic cup TID between meals, each supplement provides 290 kcal and 9 grams of protein.  Encourage PO intake.  NUTRITION DIAGNOSIS: Malnutrition related to chronic illness as evidenced by severe fat and muscle mass loss;  ongoing  Goal: Pt to meet >/= 90% of their estimated nutrition needs; not met  Monitor:  PO intake, weight trends, labs, I/O's  80 y.o. female  Admitting Dx: Fracture of femoral neck, right  ASSESSMENT: Pt seen for progressive weakness of right leg and decrease in ambulation. No recollection of fall or trauma to the right leg. Last known fall was approximately six months ago. Pt's son who is the primary caregiver notes that the pt has dementia. The son states that the pt has had a decrease in ambulation over the last three weeks.  RD consulted for poor po intake. Pt is on a dysphagia 2 diet with nectar thick liquids. Meal completion is 50%. Pt reports her appetite is "ok". Pt is agreeable to supplements. Will order. Encourage pt to eat her food at meals.   Height: Ht Readings from Last 1 Encounters:  09/13/14 $RemoveB'5\' 3"'InavPYHo$  (1.6 m)    Weight: Wt Readings from Last 1 Encounters:  09/15/14 97 lb (44 kg)    BMI:  Body mass index is 17.19 kg/(m^2). Underweight  Re-Estimated Nutritional Needs: Kcal: 1500-1700 Protein: 60-75 grams Fluid: >/= 1.5 L/day  Skin: incision right hip, non-pitting LE edema  Diet Order: DIET DYS 2 with nectar thick liquids   Intake/Output Summary (Last 24 hours) at 09/17/14 1354 Last data filed at 09/17/14 0900  Gross per 24 hour   Intake    420 ml  Output    600 ml  Net   -180 ml    Last BM: 10/29  Labs:   Recent Labs Lab 09/13/14 1515 09/14/14 0631 09/15/14 0550  NA 144 143 142  K 3.5* 3.5* 4.1  CL 104 104 103  CO2 $Re'27 27 25  'uXl$ BUN $R'12 15 12  'eh$ CREATININE 0.49* 0.39* 0.42*  CALCIUM 9.3 9.2 9.1  GLUCOSE 134* 92 101*    CBG (last 3)  No results for input(s): GLUCAP in the last 72 hours.  Scheduled Meds: . enoxaparin (LOVENOX) injection  30 mg Subcutaneous Q24H  . haloperidol lactate  2 mg Intravenous Once  . haloperidol lactate  2 mg Intravenous TID BM  . haloperidol lactate  5 mg Intravenous QHS  . metoprolol tartrate  50 mg Oral BID  . pantoprazole  40 mg Oral Daily  . [START ON 09/18/2014] predniSONE  10 mg Oral Q breakfast  . traZODone  50 mg Oral QHS    Continuous Infusions:    Past Medical History  Diagnosis Date  . Anemia   . PONV (postoperative nausea and vomiting)   . 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 Family history of anesthesia complication     "son did; don't remember what reaction was though" (10/25/2013)  . High cholesterol   . Heart murmur   . Pneumonia     "when I was a little girl" (10/25/2013)  . History of blood transfusion     "  quite a few; related to Myelodysplastic" (10/25/2013)  . Dementia     "don't know kind or stage" (10/25/2013)  . Arthritis     "hands" (10/25/2013)  . Kidney stones   . Myelodysplastic syndrome, low grade 10/09/2011  . Skin cancer of face     "several cut off face" (10/25/2013)  . Fracture of femoral neck, right 09/13/2014    Past Surgical History  Procedure Laterality Date  . Kidney stone surgery  ?1970's  . Tonsillectomy    . Vaginal hysterectomy    . Hip arthroplasty Right 09/14/2014    Procedure: RIGHT HIP BIPOLAR ARTHROPLASTY ;  Surgeon: Johnny Bridge, MD;  Location: [facility];  Service: Orthopedics;  Laterality: Right;    Kallie Locks, MS, RD, LDN Pager # [phone] After hours/ weekend pager # [phone]

## 2014-09-17 NOTE — Progress Notes (Signed)
Patient ID: Nicole Harvey, female   DOB: [birth year], 80 y.o.   MRN: [id number]     Subjective:  Patient reports pain as mild to moderate.  Patient sitting in the chair in no acute distress.  Objective:   VITALS:   Filed Vitals:   09/16/14 1444 09/16/14 1600 09/16/14 1923 09/17/14 0513  BP: 99/73  103/66 98/82  Pulse: 108  93 78  Temp: 98 F (36.7 C)  98.1 F (36.7 C) 98.4 F (36.9 C)  TempSrc: Oral  Oral Oral  Resp: 16 16 16 16   Height:      Weight:      SpO2: 94% 95% 96% 97%    ABD soft Sensation intact distally Dorsiflexion/Plantar flexion intact Incision: dressing C/D/I and no drainage  Good right lower ext function  Lab Results  Component Value Date   WBC 7.4 09/15/2014   HGB 10.0* 09/15/2014   HCT 30.4* 09/15/2014   MCV 89.7 09/15/2014   PLT 287 09/15/2014   BMET    Component Value Date/Time   NA 142 09/15/2014 0550   K 4.1 09/15/2014 0550   CL 103 09/15/2014 0550   CO2 25 09/15/2014 0550   GLUCOSE 101* 09/15/2014 0550   BUN 12 09/15/2014 0550   CREATININE 0.42* 09/15/2014 0550   CALCIUM 9.1 09/15/2014 0550   GFRNONAA >90 09/15/2014 0550   GFRAA >90 09/15/2014 0550     Assessment/Plan: 3 Days Post-Op   Principal Problem:   Fracture of femoral neck, right Active Problems:   Myelodysplastic syndrome, low grade   Intertrochanteric fracture of right hip   Dementia with behavioral disturbance   Protein-calorie malnutrition, severe   Loss of weight   Palliative care encounter   Severe aortic stenosis   Post-operative pain   Advance diet Up with therapy Continue plan per medicine wbat Dry dressing PRN Follow up with Dr Mardelle Matte in two weeks   Nicole Harvey 09/17/2014, 2:11 PM  Seen and agree.  She actually looks remarkably well.  Dispo per primary team.  Marchia Bond, MD Cell [phone]

## 2014-09-18 DIAGNOSIS — S72001G Fracture of unspecified part of neck of right femur, subsequent encounter for closed fracture with delayed healing: Secondary | ICD-10-CM

## 2014-09-18 DIAGNOSIS — S72001D Fracture of unspecified part of neck of right femur, subsequent encounter for closed fracture with routine healing: Secondary | ICD-10-CM

## 2014-09-18 MED ORDER — BENZTROPINE MESYLATE 1 MG PO TABS: 1.0000 mg | ORAL_TABLET | Freq: Two times a day (BID) | ORAL | Status: AC

## 2014-09-18 MED ORDER — ACETAMINOPHEN 160 MG/5ML PO SOLN: 1000.0000 mg | Freq: Three times a day (TID) | ORAL | Status: AC

## 2014-09-18 MED ORDER — ENSURE PUDDING PO PUDG: 1.0000 | Freq: Three times a day (TID) | ORAL | Status: AC

## 2014-09-18 MED ORDER — HALOPERIDOL 2 MG PO TABS: 2.0000 mg | ORAL_TABLET | Freq: Every day | ORAL | Status: DC

## 2014-09-18 MED ORDER — TRAZODONE HCL 50 MG PO TABS: 50.0000 mg | ORAL_TABLET | Freq: Every day | ORAL | Status: AC

## 2014-09-18 MED ORDER — HALOPERIDOL 2 MG PO TABS
2.0000 mg | ORAL_TABLET | Freq: Three times a day (TID) | ORAL | Status: DC
  Filled 2014-09-18 (×3): qty 1

## 2014-09-18 MED ORDER — HALOPERIDOL 2 MG PO TABS: 2.0000 mg | ORAL_TABLET | Freq: Three times a day (TID) | ORAL | Status: AC

## 2014-09-18 MED ORDER — ENOXAPARIN SODIUM 30 MG/0.3ML ~~LOC~~ SOLN: 30.0000 mg | SUBCUTANEOUS | Status: AC

## 2014-09-18 MED ORDER — TRAMADOL HCL 50 MG PO TABS: 50.0000 mg | ORAL_TABLET | ORAL | Status: AC | PRN

## 2014-09-18 MED ORDER — METOPROLOL TARTRATE 50 MG PO TABS: 50.0000 mg | ORAL_TABLET | Freq: Two times a day (BID) | ORAL | Status: AC

## 2014-09-18 MED ORDER — BENZTROPINE MESYLATE 1 MG PO TABS
1.0000 mg | ORAL_TABLET | Freq: Two times a day (BID) | ORAL | Status: DC
  Filled 2014-09-18 (×2): qty 1

## 2014-09-18 MED ORDER — HALOPERIDOL 2 MG PO TABS
2.0000 mg | ORAL_TABLET | Freq: Three times a day (TID) | ORAL | Status: DC
  Administered 2014-09-18 (×2): 2 mg via ORAL
  Filled 2014-09-18 (×5): qty 1

## 2014-09-18 NOTE — Progress Notes (Addendum)
Patient ID: Nicole Harvey, female   DOB: 03-26-1928, 80 y.o.   MRN: [id number]    Subjective: Some confusion Foley in for urinary retention ? SNF today  Objective: Vital signs in last 24 hours: Temp:  [97.7 F (36.5 C)-98 F (36.7 C)] 97.7 F (36.5 C) (11/03 0527) Pulse Rate:  [68-70] 70 (11/03 0527) Resp:  [16-18] 16 (11/03 0527) BP: (98-111)/(46-58) 111/58 mmHg (11/03 0527) SpO2:  [92 %-93 %] 92 % (11/03 0527) Last BM Date: 10/14/14  Intake/Output from previous day: 11/02 0701 - 11/03 0700 In: 240 [P.O.:240] Out: 650 [Urine:650] Intake/Output this shift:    Medications Current Facility-Administered Medications  Medication Dose Route Frequency Provider Last Rate Last Dose  . acetaminophen (TYLENOL) solution 1,000 mg  1,000 mg Oral TID Brock Ra Lampkin, DO   1,000 mg at 09/17/14 1600  . benztropine (COGENTIN) tablet 1 mg  1 mg Oral BID Jonetta Osgood, MD      . bisacodyl (DULCOLAX) suppository 10 mg  10 mg Rectal Daily PRN Johnny Bridge, MD      . enoxaparin (LOVENOX) injection 30 mg  30 mg Subcutaneous Q24H Johnny Bridge, MD   30 mg at 09/18/14 0840  . feeding supplement (ENSURE) (ENSURE) pudding 1 Container  1 Container Oral TID BM Kallie Locks, RD   1 Container at 09/17/14 2000  . haloperidol (HALDOL) tablet 2 mg  2 mg Oral TID AC & HS Shanker Kristeen Mans, MD   2 mg at 09/18/14 0840  . haloperidol lactate (HALDOL) injection 2 mg  2 mg Intravenous Q4H PRN Thurnell Lose, MD   2 mg at 09/16/14 1309  . haloperidol lactate (HALDOL) injection 2 mg  2 mg Intravenous Once Thurnell Lose, MD   2 mg at 09/15/14 2235  . HYDROcodone-acetaminophen (NORCO/VICODIN) 5-325 MG per tablet 1 tablet  1 tablet Oral Q4H PRN Thurnell Lose, MD   1 tablet at 09/17/14 0153  . metoprolol (LOPRESSOR) tablet 50 mg  50 mg Oral BID Thurnell Lose, MD   50 mg at 09/17/14 2251  . morphine 2 MG/ML injection 2 mg  2 mg Intravenous Q4H PRN Thurnell Lose, MD   2 mg at 09/17/14 0004  .  ondansetron (ZOFRAN) injection 4 mg  4 mg Intravenous Q6H PRN Johnny Bridge, MD      . pantoprazole (PROTONIX) EC tablet 40 mg  40 mg Oral Daily Jonetta Osgood, MD   40 mg at 09/17/14 1115  . polyethylene glycol (MIRALAX / GLYCOLAX) packet 17 g  17 g Oral Daily PRN Johnny Bridge, MD      . predniSONE (DELTASONE) tablet 10 mg  10 mg Oral Q breakfast Jonetta Osgood, MD   10 mg at 09/18/14 0840  . RESOURCE THICKENUP CLEAR   Oral PRN Thurnell Lose, MD      . traZODone (DESYREL) tablet 50 mg  50 mg Oral QHS Brock Ra Lampkin, DO   50 mg at 09/17/14 2349    PE: General appearance: alert, cooperative and no distress Lungs: clear to auscultation bilaterally Heart: regular rate and rhythm and 2/6 sys MM Abdomen: +BS, nontender Extremities: No LEE Pulses: 2+ and symmetric Skin: Warm and dry Neurologic: Grossly normal  Echocardiogram Study Conclusions  - Left ventricle: The cavity size was normal. Wall thickness was increased in a pattern of mild LVH. The estimated ejection fraction was 60%. Wall motion was normal; there were no regional wall motion abnormalities. -  Aortic valve: THERE IS SEVERE AORTIC STENOSIS. The gradients are higher than the prior study, Mean gradient (S): 50 mm Hg. Peak gradient (S): 78 mm Hg. - Right ventricle: The cavity size was normal. Systolic function was normal. - Pulmonary arteries: PA peak pressure: 37 mm Hg (S).  Assessment/Plan   1. S/p hip fracture/repair,  2. Critical Aortic stenosis  Echo shows mean and peak gradients: 50/91mmHg. Not a surgical candidate.   EF 60% with normal wall motion.   3. Chronic diastolic heart failure  Appears euvolemic to dry.  4. Postop pain:  Reports no pain currently.   5. Advanced age/dementia:  Palliative med following   Stable from cardiac standpoint for d/c to SNF      LOS: 5 days    Jenkins Rouge PA-C 09/18/2014 10:00 AM

## 2014-09-18 NOTE — Progress Notes (Signed)
Pt unable to void 6 hrs after straight cath bladder scanned at 2300 found 197 cc's. Scanned again at 0235 and found 325 cc's. Straight was done 2 times prior, placed 14 F foley per protocol.

## 2014-09-18 NOTE — Progress Notes (Signed)
Physical Therapy Treatment Patient Details Name: RONNIE MALLETTE MRN: [id number] DOB: 10-31-1928 Today's Date: 09/18/2014    History of Present Illness Pt seen for progressive weakness of right leg and decrease in ambulation found to have subacute right displaced femoral neck fracture and underwent a RTHA. PMHx:dementia    PT Comments    Pt sleepy today, so deferred ambulation.  Continue to recommend SNF for further rehab.  Follow Up Recommendations  SNF     Equipment Recommendations  None recommended by PT    Recommendations for Other Services       Precautions / Restrictions Restrictions RLE Weight Bearing: Weight bearing as tolerated    Mobility  Bed Mobility Overal bed mobility: +2 for physical assistance;Needs Assistance Bed Mobility: Supine to Sit Rolling: Mod assist;+2 for physical assistance         General bed mobility comments: Pt very sleepy and didn't really open eyes til she was sitting EOB  Transfers Overall transfer level: Needs assistance Equipment used: 2 person hand held assist Transfers: Stand Pivot Transfers;Sit to/from Stand Sit to Stand: Min assist Stand pivot transfers: +2 physical assistance;Min assist       General transfer comment: Pt with increased lethargy today and only did SPT.  Ambulation/Gait                 Stairs            Wheelchair Mobility    Modified Rankin (Stroke Patients Only)       Balance     Sitting balance-Leahy Scale: Poor     Standing balance support: Bilateral upper extremity supported Standing balance-Leahy Scale: Poor                      Cognition Arousal/Alertness: Lethargic Behavior During Therapy: WFL for tasks assessed/performed Overall Cognitive Status: History of cognitive impairments - at baseline   Orientation Level: Disoriented to;Place;Time;Situation   Memory: Decreased short-term memory Following Commands: Follows one step commands with increased  time Safety/Judgement: Decreased awareness of safety;Decreased awareness of deficits   Problem Solving: Slow processing;Decreased initiation;Difficulty sequencing;Requires verbal cues;Requires tactile cues      Exercises      General Comments        Pertinent Vitals/Pain Pain Assessment: Faces Faces Pain Scale: Hurts a little bit    Home Living                      Prior Function            PT Goals (current goals can now be found in the care plan section) Acute Rehab PT Goals Potential to Achieve Goals: Good Progress towards PT goals: Not progressing toward goals - comment (lethragic today)    Frequency  Min 3X/week    PT Plan Frequency needs to be updated    Co-evaluation             End of Session   Activity Tolerance: Patient limited by lethargy Patient left: in chair;with chair alarm set;with call bell/phone within reach     Time: 0850-0903 PT Time Calculation (min): 13 min  Charges:  $Therapeutic Activity: 8-22 mins                    G Codes:      Pollie Poma LUBECK 09/18/2014, 11:13 AM

## 2014-09-18 NOTE — Clinical Social Work Note (Signed)
Pt to be discharged to [facility]. Pt's son, Fritz Pickerel, updated regarding discharge.  [facility]: [phone] Transportation: EMS ([facility]) scheduled for 4pm (SNF bed not available until 4pm)  Lubertha Sayres, [place] [phone]) Licensed Clinical Social Worker Orthopedics [phone]) and Surgical [phone])

## 2014-09-18 NOTE — Progress Notes (Signed)
     Subjective:  Patient reports pain as mild.  Patient sitting up and eating breakfast, looking well.  Foley placed overnight due to retention.  Objective:   VITALS:   Filed Vitals:   09/17/14 2011 09/18/14 0000 09/18/14 0400 09/18/14 0527  BP: 104/52   111/58  Pulse: 70   70  Temp: 97.7 F (36.5 C)   97.7 F (36.5 C)  TempSrc:      Resp: 18 18 18 16   Height:      Weight:      SpO2: 93% 93% 93% 92%    Neurologically intact Sensation intact distally Dorsiflexion/Plantar flexion intact Incision: dressing C/D/I   Lab Results  Component Value Date   WBC 7.4 09/15/2014   HGB 10.0* 09/15/2014   HCT 30.4* 09/15/2014   MCV 89.7 09/15/2014   PLT 287 09/15/2014   BMET    Component Value Date/Time   NA 142 09/15/2014 0550   K 4.1 09/15/2014 0550   CL 103 09/15/2014 0550   CO2 25 09/15/2014 0550   GLUCOSE 101* 09/15/2014 0550   BUN 12 09/15/2014 0550   CREATININE 0.42* 09/15/2014 0550   CALCIUM 9.1 09/15/2014 0550   GFRNONAA >90 09/15/2014 0550   GFRAA >90 09/15/2014 0550     Assessment/Plan: 4 Days Post-Op   Principal Problem:   Fracture of femoral neck, right Active Problems:   Myelodysplastic syndrome, low grade   Intertrochanteric fracture of right hip   Dementia with behavioral disturbance   Protein-calorie malnutrition, severe   Loss of weight   Palliative care encounter   Severe aortic stenosis   Post-operative pain   Advance diet Up with therapy Discharge to SNF Per primary team, WBAT, lovenox, RTC with me in 2 weeks   Brysten Reister P 09/18/2014, 10:23 AM   Marchia Bond, MD Cell [phone]

## 2014-09-18 NOTE — Progress Notes (Signed)
SCDs off 

## 2014-09-18 NOTE — Progress Notes (Signed)
PATIENT DETAILS Name: Nicole Harvey Age: 80 y.o. Sex: female Date of Birth: [birth year] Admit Date: 09/13/2014 Admitting Physician Kelvin Cellar, MD YIF:[email or web address],OINOMV Danne Baxter, MD  Subjective: Sitting at bedside, calm and actually follows some commands this am. Developed acute urinary retention last night, now has a foley catheter  Assessment/Plan: Principal Problem:   Fracture of femoral neck, right:Underwent open reduction internal fixation on 10/30.Had significant Delirium post operatively,significantly better today, no sitter for 24 hours. Actually follows some commands this am. Calm and quiet. Was on IV Haldol, will transition to oral Haldol today. Weightbearing as tolerated on the right leg per orthopedics, Lovenox for DVT prophylaxis 4 weeks.Stable for SNF today  Active Problems:  Acute Delirium:has severe Dementia, significantly better with IV Haldol, will transition to oral Haldol today. Difficult situation, minimize narcotics as much as possible. Suspect will continue to have some amount of delirium as long as in unfamiliar surroundings and in pain. However much improved, stable for discharge to SNF today   Myelodysplastic syndrome, low grade:chronically maintained on prednisone, placed on IV Solumedrol on admission,now transitioned back to prednisone.Monitor CBC periodically while at SNF  Severe aortic stenosis: Valvular area 0.62 cm on echogram in May 2014. Strict intake and output monitor, avoid ACE/ARB or preload reduction.Seen by cardiology for pre-operative evaluation.  Acute Urinary Retention:developed 11/2 PM,? Anticholinergic side effects from Haldol. Continue with Foley catheter on discharge, will add Cogentin. Suggest voiding trial to see if foley catheter can be removed within a week while at the nursing facility.  Protein-calorie malnutrition, severe:continue with Supplements  GERD: On PPI.  Disposition: Remain inpatient-SNF  today  Antibiotics:  IV Cefazolin X 1 10/29  DVT Prophylaxis: Prophylactic Lovenox   Code Status:  DNR  Family Communication Larry-son-via the phone on 11/3  Procedures:  Open reduction internal fixation on 10/30  CONSULTS:  orthopedic surgery and Palliative Care   MEDICATIONS: Scheduled Meds: . acetaminophen (TYLENOL) oral liquid 160 mg/5 mL  1,000 mg Oral TID  . benztropine  1 mg Oral BID  . enoxaparin (LOVENOX) injection  30 mg Subcutaneous Q24H  . feeding supplement (ENSURE)  1 Container Oral TID BM  . haloperidol  2 mg Oral TID AC & HS  . haloperidol lactate  2 mg Intravenous Once  . metoprolol tartrate  50 mg Oral BID  . pantoprazole  40 mg Oral Daily  . predniSONE  10 mg Oral Q breakfast  . traZODone  50 mg Oral QHS   Continuous Infusions:  PRN Meds:.bisacodyl, haloperidol lactate, HYDROcodone-acetaminophen, morphine injection, [DISCONTINUED] ondansetron **OR** ondansetron (ZOFRAN) IV, polyethylene glycol, RESOURCE THICKENUP CLEAR  Antibiotics: Anti-infectives    Start     Dose/Rate Route Frequency Ordered Stop   09/14/14 1900  ceFAZolin (ANCEF) IVPB 2 g/50 mL premix     2 g100 mL/hr over 30 Minutes Intravenous Every 6 hours 09/14/14 1806 09/15/14 0213   09/13/14 1114  ceFAZolin (ANCEF) IVPB 2 g/50 mL premix  Status:  Discontinued     2 g100 mL/hr over 30 Minutes Intravenous On call to O.R. 09/13/14 1114 09/14/14 1806       PHYSICAL EXAM: Vital signs in last 24 hours: Filed Vitals:   09/17/14 2011 09/18/14 0000 09/18/14 0400 09/18/14 0527  BP: 104/52   111/58  Pulse: 70   70  Temp: 97.7 F (36.5 C)   97.7 F (36.5 C)  TempSrc:      Resp: 18 18 18 16   Height:  Weight:      SpO2: 93% 93% 93% 92%    Weight change:  Filed Weights   09/13/14 1111 09/15/14 0412  Weight: 42.185 kg (93 lb) 44 kg (97 lb)   Body mass index is 17.19 kg/(m^2).   Gen Exam: Awake, pleasantly confused. But much more calm-and acutually follows some commands Neck:  Supple, No JVD.   Chest: B/L Clear.   CVS: S1 S2 Regular, +3/6 syst mumur Abdomen: soft, BS +, non tender, non distended.  Extremities: no edema, lower extremities warm to touch. Neurologic: Non Focal-seems to be moving all 4 ext   Skin: No Rash.   Wounds: N/A.    Intake/Output from previous day:  Intake/Output Summary (Last 24 hours) at 09/18/14 0929 Last data filed at 09/17/14 1625  Gross per 24 hour  Intake    120 ml  Output    650 ml  Net   -530 ml     LAB RESULTS: CBC  Recent Labs Lab 09/13/14 1515 09/14/14 0631 09/15/14 0550  WBC 6.5 6.6 7.4  HGB 11.6* 11.7* 10.0*  HCT 35.6* 36.3 30.4*  PLT 275 267 287  MCV 89.9 91.2 89.7  MCH 29.3 29.4 29.5  MCHC 32.6 32.2 32.9  RDW 15.1 15.0 15.2  LYMPHSABS 0.8  --   --   MONOABS 0.6  --   --   EOSABS 0.1  --   --   BASOSABS 0.1  --   --     Chemistries   Recent Labs Lab 09/13/14 1515 09/14/14 0631 09/15/14 0550  NA 144 143 142  K 3.5* 3.5* 4.1  CL 104 104 103  CO2 27 27 25   GLUCOSE 134* 92 101*  BUN 12 15 12   CREATININE 0.49* 0.39* 0.42*  CALCIUM 9.3 9.2 9.1    CBG: No results for input(s): GLUCAP in the last 168 hours.  GFR Estimated Creatinine Clearance: 36.4 mL/min (by C-G formula based on Cr of 0.42).  Coagulation profile  Recent Labs Lab 09/13/14 1515  INR 1.14    Cardiac Enzymes  Recent Labs Lab 09/13/14 1515  TROPONINI <0.30    Invalid input(s): POCBNP No results for input(s): DDIMER in the last 72 hours. No results for input(s): HGBA1C in the last 72 hours. No results for input(s): CHOL, HDL, LDLCALC, TRIG, CHOLHDL, LDLDIRECT in the last 72 hours. No results for input(s): TSH, T4TOTAL, T3FREE, THYROIDAB in the last 72 hours.  Invalid input(s): FREET3 No results for input(s): VITAMINB12, FOLATE, FERRITIN, TIBC, IRON, RETICCTPCT in the last 72 hours. No results for input(s): LIPASE, AMYLASE in the last 72 hours.  Urine Studies No results for input(s): UHGB, CRYS in the last 72  hours.  Invalid input(s): UACOL, UAPR, USPG, UPH, UTP, UGL, UKET, UBIL, UNIT, UROB, ULEU, UEPI, UWBC, URBC, UBAC, CAST, UCOM, BILUA  MICROBIOLOGY: Recent Results (from the past 240 hour(s))  Surgical pcr screen     Status: None   Collection Time: 09/10/14  1:26 PM  Result Value Ref Range Status   MRSA, PCR NEGATIVE NEGATIVE Final   Staphylococcus aureus NEGATIVE NEGATIVE Final    Comment:        The Xpert SA Assay (FDA approved for NASAL specimens in patients over 62 years of age), is one component of a comprehensive surveillance program.  Test performance has been validated by [employer] for patients greater than or equal to 41 year old. It is not intended to diagnose infection nor to guide or monitor treatment.  Surgical pcr screen  Status: None   Collection Time: 09/14/14 12:35 AM  Result Value Ref Range Status   MRSA, PCR NEGATIVE NEGATIVE Final   Staphylococcus aureus NEGATIVE NEGATIVE Final    Comment:        The Xpert SA Assay (FDA approved for NASAL specimens in patients over 78 years of age), is one component of a comprehensive surveillance program.  Test performance has been validated by [employer] for patients greater than or equal to 78 year old. It is not intended to diagnose infection nor to guide or monitor treatment.  MRSA PCR Screening     Status: None   Collection Time: 09/14/14  6:47 PM  Result Value Ref Range Status   MRSA by PCR NEGATIVE NEGATIVE Final    Comment:        The GeneXpert MRSA Assay (FDA approved for NASAL specimens only), is one component of a comprehensive MRSA colonization surveillance program. It is not intended to diagnose MRSA infection nor to guide or monitor treatment for MRSA infections.    RADIOLOGY STUDIES/RESULTS: Dg Chest 2 View  09/10/2014   CLINICAL DATA:  For right hip arthroplasty, history of heart murmur  EXAM: CHEST  2 VIEW  COMPARISON:  Chest x-ray of 10/24/2013  FINDINGS: Linear opacities are  noted both in lung bases which may reflect linear atelectasis or possibly scarring. No definite pneumonia or effusion is seen. The lungs are slightly hyperaerated. No mediastinal or hilar adenopathy is seen. The heart is mildly enlarged. The descending thoracic aorta is ectatic. The bones are diffusely osteopenic and multiple thoracic and lumbar compression deformities are noted which appear new compared to the lateral chest x-ray from December of 2014.  IMPRESSION: 1. Bibasilar linear atelectasis or scarring. 2. Osteopenia and multiple thoracic and lumbar vertebral compression deformities, new since December of 2014   Electronically Signed   By: Ivar Drape M.D.   On: 09/10/2014 13:39   Pelvis Portable  09/14/2014   CLINICAL DATA:  Right hip replacement.  EXAM: PORTABLE PELVIS 1-2 VIEWS  COMPARISON:  None.  FINDINGS: Right hip replacement with good anatomic alignment. No acute abnormality. Diffuse osteopenia.  IMPRESSION: Right hip replacement with good anatomic alignment.   Electronically Signed   By: Marcello Moores  Register   On: 09/14/2014 17:26   Hip Portable 1 View Right  09/16/2014   CLINICAL DATA:  Right hip fracture.  Postop from hip arthroplasty.  EXAM: PORTABLE RIGHT HIP - 1 VIEW  COMPARISON:  None.  FINDINGS: Right hip prosthesis is seen in expected position. No evidence of fracture or dislocation.  IMPRESSION: Right hip prosthesis in appropriate position. No fracture or other acute findings identified.   Electronically Signed   By: Earle Gell M.D.   On: 09/16/2014 10:46    Oren Binet, MD  [facility] Pager:[phone]  If 7PM-7AM, please contact night-coverage [email or web address] Password [email or web address] 09/18/2014, 9:29 AM   LOS: 5 days

## 2014-09-18 NOTE — Progress Notes (Signed)
Patient Nicole Harvey      DOB: [birth year]      IRC:[id number]   Palliative Medicine Team at [facility] Progress Note    Subjective: Ortho notes she was doing a bit better today. More drowsy this afternoon when I saw her and can not provide any ROS. Spoke with son who states she remains more confused than baseline but doing much better than few days ago. Much more calm today.    Filed Vitals:   09/18/14 0527  BP: 111/58  Pulse: 70  Temp: 97.7 F (36.5 C)  Resp: 16   Physical exam: GEN: alert, NAD HEENT: [place], sclera anicteric CV: RRR LUNGS: CTAB ABD: soft EXT: no edema  CBC    Component Value Date/Time   WBC 7.4 09/15/2014 0550   WBC 6.5 11/28/2013 1105   RBC 3.39* 09/15/2014 0550   RBC 3.52* 11/28/2013 1105   RBC 3.66* 11/28/2013 1105   HGB 10.0* 09/15/2014 0550   HGB 11.1* 11/28/2013 1105   HCT 30.4* 09/15/2014 0550   HCT 33.7* 11/28/2013 1105   PLT 287 09/15/2014 0550   PLT 275 11/28/2013 1105   MCV 89.7 09/15/2014 0550   MCV 96 11/28/2013 1105   MCH 29.5 09/15/2014 0550   MCH 31.5 11/28/2013 1105   MCHC 32.9 09/15/2014 0550   MCHC 32.9 11/28/2013 1105   RDW 15.2 09/15/2014 0550   RDW 14.9 11/28/2013 1105   LYMPHSABS 0.8 09/13/2014 1515   LYMPHSABS 0.7* 11/28/2013 1105   MONOABS 0.6 09/13/2014 1515   EOSABS 0.1 09/13/2014 1515   EOSABS 0.0 11/28/2013 1105   BASOSABS 0.1 09/13/2014 1515   BASOSABS 0.1 11/28/2013 1105    CMP     Component Value Date/Time   NA 142 09/15/2014 0550   K 4.1 09/15/2014 0550   CL 103 09/15/2014 0550   CO2 25 09/15/2014 0550   GLUCOSE 101* 09/15/2014 0550   BUN 12 09/15/2014 0550   CREATININE 0.42* 09/15/2014 0550   CALCIUM 9.1 09/15/2014 0550   PROT 6.6 09/13/2014 1515   ALBUMIN 3.2* 09/13/2014 1515   AST 15 09/13/2014 1515   ALT 18 09/13/2014 1515   ALKPHOS 337* 09/13/2014 1515   BILITOT 0.4 09/13/2014 1515   GFRNONAA >90 09/15/2014 0550   GFRAA >90 09/15/2014 0550       Assessment and plan: 80 yo  female with PMHx of dementia, severe AS who presented with hip fc. Post-op delirium.  1. Code Status: DNR   2. Goals of Care: Plan is for SNF/rehab.  Spoke with son over phone today. He is aware that she is at risk for setbacks and will monitor how she does at rehab.  He is hopeful to meet goal of return to ambulation.  I would recommend arrangement of outpatient palliative care service to follow as she will be high risk for not meeting this goal. If not meeting goals, would be reasonable to transition to hospice care.    3. Symptom Management:  1. Acute Delirium- on oral haldol during day.  My next step would be to make daytime dose PRN and try to wean off.  2. Hip Pain- Tylenol scheduled. Hopefully can improve taking PO.  3. Weight Loss- declining weight for past year. Was ~110lbs last year and now in 58's. Can consider mirtazapine based on how she does. Her steroids probably are the more effective medication for this though.   4. Psychosocial/Spiritual: Lived at home with husband who has Alzheimers. Paid caregivers in home. Rapid decline  over past year. Son Fritz Pickerel very supportive and assists both parents frequently.   Total Time: 25 minutes  >50% of time spent in counseling and coordination of care as above.   Doran Clay D.O. Palliative Medicine Team at [facility]  Pager: [phone] Team Phone: [phone]

## 2014-09-18 NOTE — Discharge Summary (Signed)
PATIENT DETAILS Name: Nicole Harvey Age: 80 y.o. Sex: female Date of Birth: 1927/12/27 MRN: [id number]. Admitting Physician: Kelvin Cellar, MD [id number] Danne Baxter, MD  Admit Date: 09/13/2014 Discharge date: 09/18/2014  Recommendations for Outpatient Follow-up:  1. Palliative care follow-up while at skilled nursing facility 2. Recommend-attempt voiding trial to remove Foley catheter in 1 week 3. Repeat CBC/chemistries in 1 week 4. Follow-up with Dr. Lorrin Goodell) in 2 weeks 5. Is on scheduled Tylenol-1000 mg 3 times a day-be careful with further Tylenol dosing  PRIMARY DISCHARGE DIAGNOSIS:  Principal Problem:   Fracture of femoral neck, right Active Problems:   Myelodysplastic syndrome, low grade   Intertrochanteric fracture of right hip   Dementia with behavioral disturbance   Protein-calorie malnutrition, severe   Loss of weight   Palliative care encounter   Severe aortic stenosis   Post-operative pain      PAST MEDICAL HISTORY: Past Medical History  Diagnosis Date  . Anemia   . PONV (postoperative nausea and vomiting)   . 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 Family history of anesthesia complication     "son did; don't remember what reaction was though" (10/25/2013)  . High cholesterol   . Heart murmur   . Pneumonia     "when I was a little girl" (10/25/2013)  . History of blood transfusion     "quite a few; related to Myelodysplastic" (10/25/2013)  . Dementia     "don't know kind or stage" (10/25/2013)  . Arthritis     "hands" (10/25/2013)  . Kidney stones   . Myelodysplastic syndrome, low grade 10/09/2011  . Skin cancer of face     "several cut off face" (10/25/2013)  . Fracture of femoral neck, right 09/13/2014    DISCHARGE MEDICATIONS: Current Discharge Medication List    START taking these medications   Details  acetaminophen (TYLENOL) 160 MG/5ML solution Take 31.3 mLs (1,000 mg total) by mouth 3 (three) times  daily. For 1 more week from 09/18/14, and then change to when necessary Tylenol Qty: 120 mL, Refills: 0    benztropine (COGENTIN) 1 MG tablet Take 1 tablet (1 mg total) by mouth 2 (two) times daily.    enoxaparin (LOVENOX) 30 MG/0.3ML injection Inject 0.3 mLs (30 mg total) into the skin daily. Qty: 21 Syringe, Refills: 0    feeding supplement, ENSURE, (ENSURE) PUDG Take 1 Container by mouth 3 (three) times daily between meals. Refills: 0    haloperidol (HALDOL) 2 MG tablet Take 1 tablet (2 mg total) by mouth 4 (four) times daily -  before meals and at bedtime. Qty: 30 tablet, Refills: 0    metoprolol (LOPRESSOR) 50 MG tablet Take 1 tablet (50 mg total) by mouth 2 (two) times daily.    sennosides-docusate sodium (SENOKOT-S) 8.6-50 MG tablet Take 2 tablets by mouth daily. Qty: 30 tablet, Refills: 1    traZODone (DESYREL) 50 MG tablet Take 1 tablet (50 mg total) by mouth at bedtime.      CONTINUE these medications which have CHANGED   Details  traMADol (ULTRAM) 50 MG tablet Take 1 tablet (50 mg total) by mouth every 4 (four) hours as needed (pain). Qty: 30 tablet, Refills: 0      CONTINUE these medications which have NOT CHANGED   Details  folic acid (FOLVITE) 1 MG tablet Take 1 mg by mouth daily.    pantoprazole (PROTONIX) 40 MG tablet Take 40 mg by mouth daily.    predniSONE (DELTASONE) 20 MG tablet Take 10  mg by mouth daily with breakfast.        ALLERGIES:   Allergies  Allergen Reactions  . Other Shortness Of Breath and Itching    Med for ???? Instructed patient to bring.Previous medication given by Oncologist.  . Codeine Nausea And Vomiting  . Epinephrine Other (See Comments)    Unknown  . Sulfa Antibiotics Other (See Comments)    Unknown    BRIEF HPI:  See H&P, Labs, Consult and Test reports for all details in brief, patient is a 80 y.o. female, sent as a direct admission by Dr. Mardelle Matte. Pt seen for progressive weakness of right leg and decrease in  ambulation.further workup revealed right hip fracture, patient was admitted for further evaluation and treatment.  CONSULTATIONS:   orthopedic surgery  PERTINENT RADIOLOGIC STUDIES: Dg Chest 2 View  09/10/2014   CLINICAL DATA:  For right hip arthroplasty, history of heart murmur  EXAM: CHEST  2 VIEW  COMPARISON:  Chest x-ray of 10/24/2013  FINDINGS: Linear opacities are noted both in lung bases which may reflect linear atelectasis or possibly scarring. No definite pneumonia or effusion is seen. The lungs are slightly hyperaerated. No mediastinal or hilar adenopathy is seen. The heart is mildly enlarged. The descending thoracic aorta is ectatic. The bones are diffusely osteopenic and multiple thoracic and lumbar compression deformities are noted which appear new compared to the lateral chest x-ray from December of 2014.  IMPRESSION: 1. Bibasilar linear atelectasis or scarring. 2. Osteopenia and multiple thoracic and lumbar vertebral compression deformities, new since December of 2014   Electronically Signed   By: Ivar Drape M.D.   On: 09/10/2014 13:39   Pelvis Portable  09/14/2014   CLINICAL DATA:  Right hip replacement.  EXAM: PORTABLE PELVIS 1-2 VIEWS  COMPARISON:  None.  FINDINGS: Right hip replacement with good anatomic alignment. No acute abnormality. Diffuse osteopenia.  IMPRESSION: Right hip replacement with good anatomic alignment.   Electronically Signed   By: Marcello Moores  Register   On: 09/14/2014 17:26   Hip Portable 1 View Right  09/16/2014   CLINICAL DATA:  Right hip fracture.  Postop from hip arthroplasty.  EXAM: PORTABLE RIGHT HIP - 1 VIEW  COMPARISON:  None.  FINDINGS: Right hip prosthesis is seen in expected position. No evidence of fracture or dislocation.  IMPRESSION: Right hip prosthesis in appropriate position. No fracture or other acute findings identified.   Electronically Signed   By: Earle Gell M.D.   On: 09/16/2014 10:46     PERTINENT LAB RESULTS: CBC: No results for  input(s): WBC, HGB, HCT, PLT in the last 72 hours. CMET CMP     Component Value Date/Time   NA 142 09/15/2014 0550   K 4.1 09/15/2014 0550   CL 103 09/15/2014 0550   CO2 25 09/15/2014 0550   GLUCOSE 101* 09/15/2014 0550   BUN 12 09/15/2014 0550   CREATININE 0.42* 09/15/2014 0550   CALCIUM 9.1 09/15/2014 0550   PROT 6.6 09/13/2014 1515   ALBUMIN 3.2* 09/13/2014 1515   AST 15 09/13/2014 1515   ALT 18 09/13/2014 1515   ALKPHOS 337* 09/13/2014 1515   BILITOT 0.4 09/13/2014 1515   GFRNONAA >90 09/15/2014 0550   GFRAA >90 09/15/2014 0550    GFR Estimated Creatinine Clearance: 36.4 mL/min (by C-G formula based on Cr of 0.42). No results for input(s): LIPASE, AMYLASE in the last 72 hours. No results for input(s): CKTOTAL, CKMB, CKMBINDEX, TROPONINI in the last 72 hours. Invalid input(s): POCBNP No results for  input(s): DDIMER in the last 72 hours. No results for input(s): HGBA1C in the last 72 hours. No results for input(s): CHOL, HDL, LDLCALC, TRIG, CHOLHDL, LDLDIRECT in the last 72 hours. No results for input(s): TSH, T4TOTAL, T3FREE, THYROIDAB in the last 72 hours.  Invalid input(s): FREET3 No results for input(s): VITAMINB12, FOLATE, FERRITIN, TIBC, IRON, RETICCTPCT in the last 72 hours. Coags: No results for input(s): INR in the last 72 hours.  Invalid input(s): PT Microbiology: Recent Results (from the past 240 hour(s))  Surgical pcr screen     Status: None   Collection Time: 09/10/14  1:26 PM  Result Value Ref Range Status   MRSA, PCR NEGATIVE NEGATIVE Final   Staphylococcus aureus NEGATIVE NEGATIVE Final    Comment:        The Xpert SA Assay (FDA approved for NASAL specimens in patients over 84 years of age), is one component of a comprehensive surveillance program.  Test performance has been validated by [employer] for patients greater than or equal to 76 year old. It is not intended to diagnose infection nor to guide or monitor treatment.  Surgical pcr  screen     Status: None   Collection Time: 09/14/14 12:35 AM  Result Value Ref Range Status   MRSA, PCR NEGATIVE NEGATIVE Final   Staphylococcus aureus NEGATIVE NEGATIVE Final    Comment:        The Xpert SA Assay (FDA approved for NASAL specimens in patients over 75 years of age), is one component of a comprehensive surveillance program.  Test performance has been validated by [employer] for patients greater than or equal to 53 year old. It is not intended to diagnose infection nor to guide or monitor treatment.  MRSA PCR Screening     Status: None   Collection Time: 09/14/14  6:47 PM  Result Value Ref Range Status   MRSA by PCR NEGATIVE NEGATIVE Final    Comment:        The GeneXpert MRSA Assay (FDA approved for NASAL specimens only), is one component of a comprehensive MRSA colonization surveillance program. It is not intended to diagnose MRSA infection nor to guide or monitor treatment for MRSA infections.     BRIEF HOSPITAL COURSE:  Fracture of femoral neck, right:Underwent open reduction internal fixation on 10/30.Had significant Delirium post operatively,significantly better today, no sitter for 24 hours. Actually follows some commands this am. Calm and quiet. Was on IV Haldol, will transition to oral Haldol today. Weightbearing as tolerated on the right leg per orthopedics, Lovenox for DVT prophylaxis for 3 weeks.Stable for SNF today  Active Problems: Acute Delirium:has severe Dementia, significantly better with IV Haldol, will transition to oral Haldol today. Difficult situation, minimize narcotics as much as possible. Suspect will continue to have some amount of delirium as long as in unfamiliar surroundings and in pain. However much improved, stable for discharge to SNF today.Please note, family aware that patient remains at risk of delirium as long as she is in unfamiliar surroundings-hospital/SNF  Myelodysplastic syndrome, low grade:chronically maintained on  prednisone, placed on IV Solumedrol on admission,now transitioned back to prednisone.Monitor CBC periodically while at SNF  Severe aortic stenosis: Valvular area 0.62 cm on echogram in May 2014. Strict intake and output monitor, avoid ACE/ARB or preload reduction.Seen by cardiology for pre-operative evaluation.  Acute Urinary Retention:developed 11/2 PM,? Anticholinergic side effects from Haldol. Continue with Foley catheter on discharge, will add Cogentin. Suggest voiding trial to see if foley catheter can be removed within  a week while at the nursing facility.  Protein-calorie malnutrition, severe:continue with Supplements  GERD: On PPI.  TODAY-DAY OF DISCHARGE:  Subjective:   Sela Falk today appears calm, quiet. Follows some commands.Sitting on chair at bedside  Objective:   Blood pressure 111/58, pulse 70, temperature 97.7 F (36.5 C), temperature source Oral, resp. rate 16, height 5\' 3"  (1.6 m), weight 44 kg (97 lb), SpO2 92 %.  Intake/Output Summary (Last 24 hours) at 09/18/14 0947 Last data filed at 09/17/14 1625  Gross per 24 hour  Intake    120 ml  Output    650 ml  Net   -530 ml   Filed Weights   09/13/14 1111 09/15/14 0412  Weight: 42.185 kg (93 lb) 44 kg (97 lb)    Exam Awake and somewhat alert, No new F.N deficits. Dennis.AT,PERRAL Supple Neck,No JVD, No cervical lymphadenopathy appriciated.  Symmetrical Chest wall movement, Good air movement bilaterally, CTAB RRR,No Gallops,Rubs or new Murmurs, No Parasternal Heave +ve B.Sounds, Abd Soft, Non tender, No organomegaly appriciated, No rebound -guarding or rigidity. No Cyanosis, Clubbing or edema, No new Rash or bruise  DISCHARGE CONDITION: Stable  DISPOSITION: SNF  DISCHARGE INSTRUCTIONS:    Activity:  As tolerated with Full fall precautions use walker/cane & assistance as needed  CODE STATUS: DNR  Diet recommendation: Heart Healthy diet-Dys 2 diet-see below   Discharge Instructions    Call  MD for:  redness, tenderness, or signs of infection (pain, swelling, redness, odor or green/yellow discharge around incision site)    Complete by:  As directed      Diet - low sodium heart healthy    Complete by:  As directed   Diet recommendations: Dysphagia 2 (fine chop);Nectar-thick liquid Liquids provided via: Cup Medication Administration: Crushed with puree Supervision: Patient able to self feed;Full supervision/cueing for compensatory strategies Compensations: Slow rate;Small sips/bites;Check for pocketing Postural Changes and/or Swallow Maneuvers: Seated upright 90 degrees     Increase activity slowly    Complete by:  As directed      Posterior total hip precautions    Complete by:  As directed      Weight bearing as tolerated    Complete by:  As directed            Follow-up Information    Follow up with Johnny Bridge, MD. Schedule an appointment as soon as possible for a visit in 2 weeks.   Specialty:  Orthopedic Surgery   Contact information:   [place] [address] [phone]       Total Time spent on discharge equals 45 minutes.  SignedOren Binet 09/18/2014 9:47 AM

## 2014-10-16 DEATH — deceased

## 2014-12-09 IMAGING — CR DG CHEST 2V
2 series · 2 of 2 positions shown · non-contrast
Comparison: Chest x-ray of 10/24/2013

CLINICAL DATA: For right hip arthroplasty, history of heart murmur

EXAM:
CHEST  2 VIEW

[w chest pa]
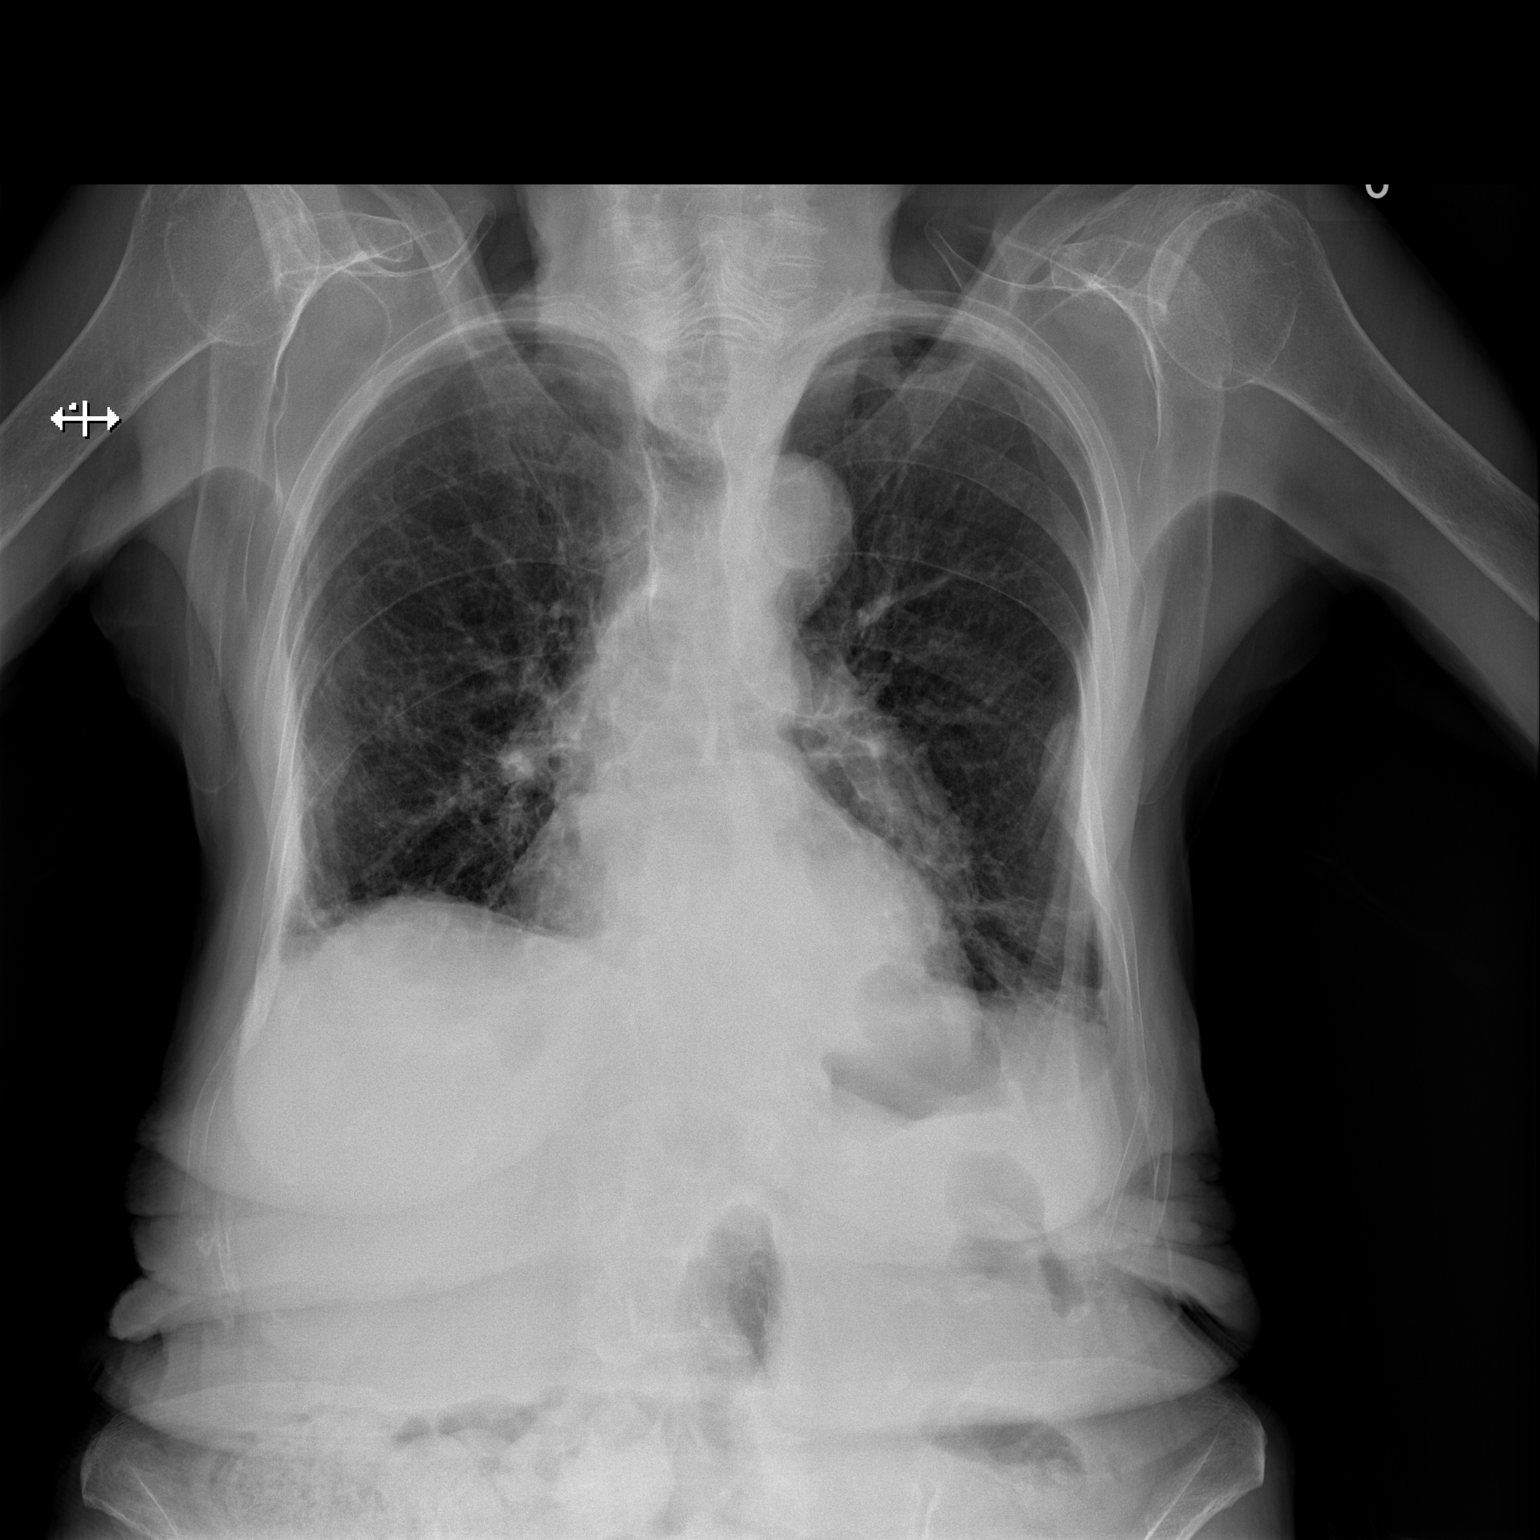

[w chest lat]
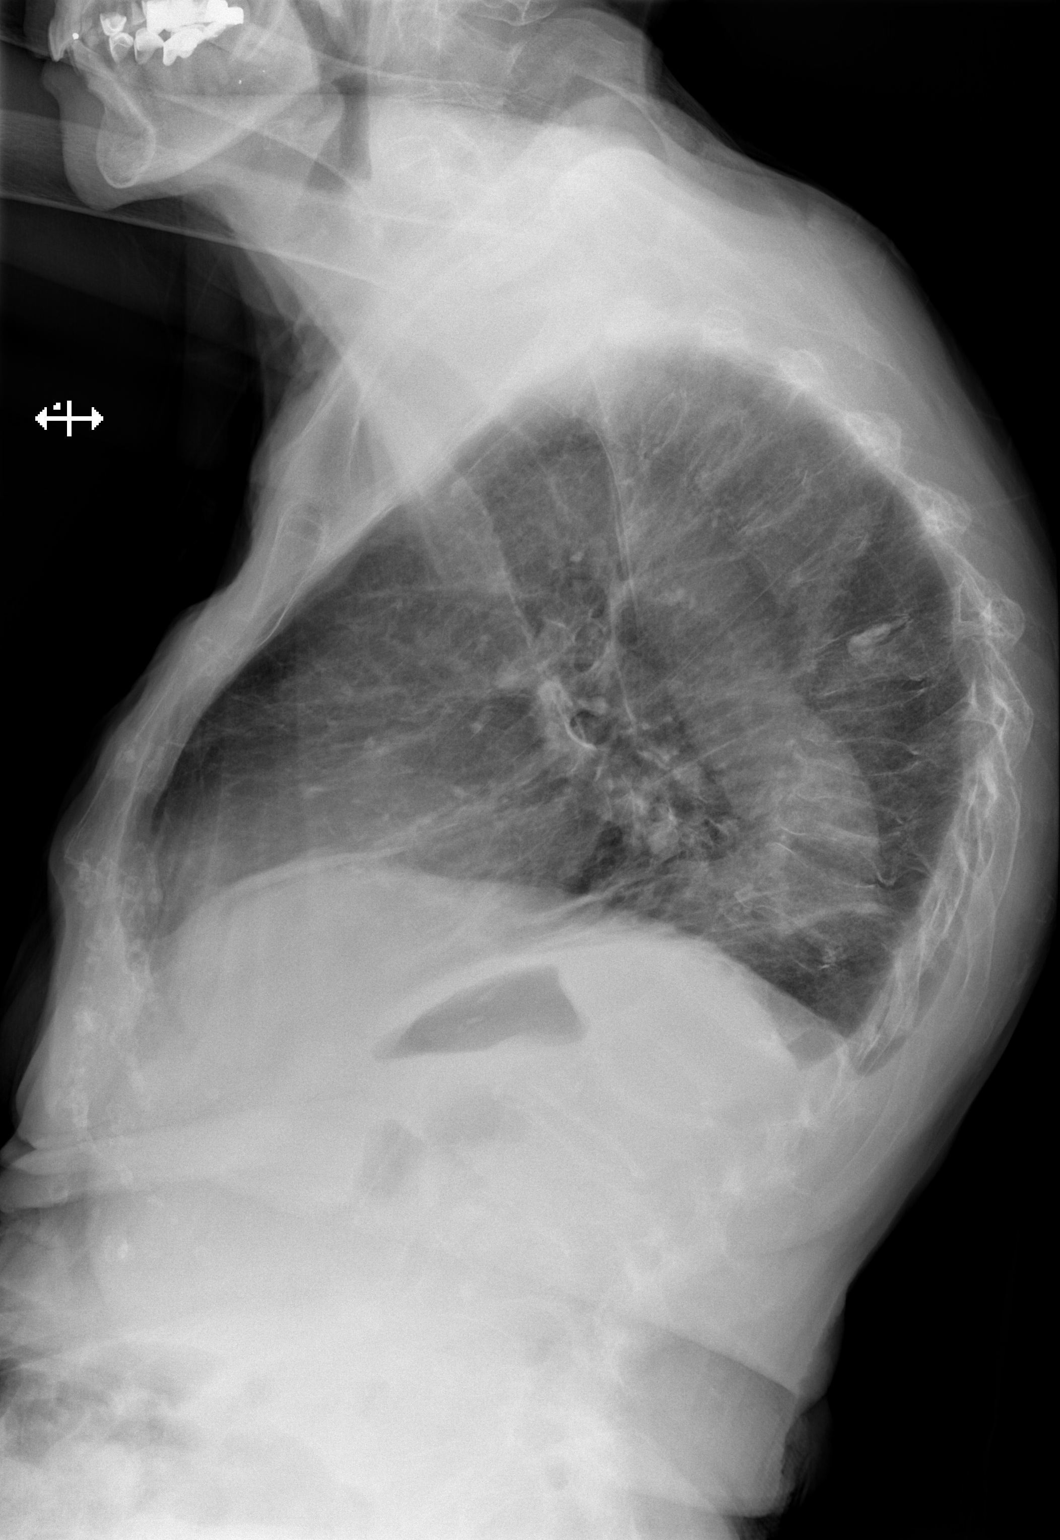

[2 of 2 positions shown; findings below may reference images not displayed]

FINDINGS: Linear opacities are noted both in lung bases which may reflect
linear atelectasis or possibly scarring. No definite pneumonia or
effusion is seen. The lungs are slightly hyperaerated. No
mediastinal or hilar adenopathy is seen. The heart is mildly
enlarged. The descending thoracic aorta is ectatic. The bones are
diffusely osteopenic and multiple thoracic and lumbar compression
deformities are noted which appear new compared to the lateral chest
x-ray from Wednesday October, 2013.
IMPRESSION: 1. Bibasilar linear atelectasis or scarring.
2. Osteopenia and multiple thoracic and lumbar vertebral compression
deformities, new since Wednesday October, 2013

## 2014-12-13 IMAGING — CR DG PORTABLE PELVIS
1 series · 1 of 1 positions shown · non-contrast
Comparison: None.

CLINICAL DATA: Right hip replacement.

EXAM:
PORTABLE PELVIS 1-2 VIEWS

[AP]
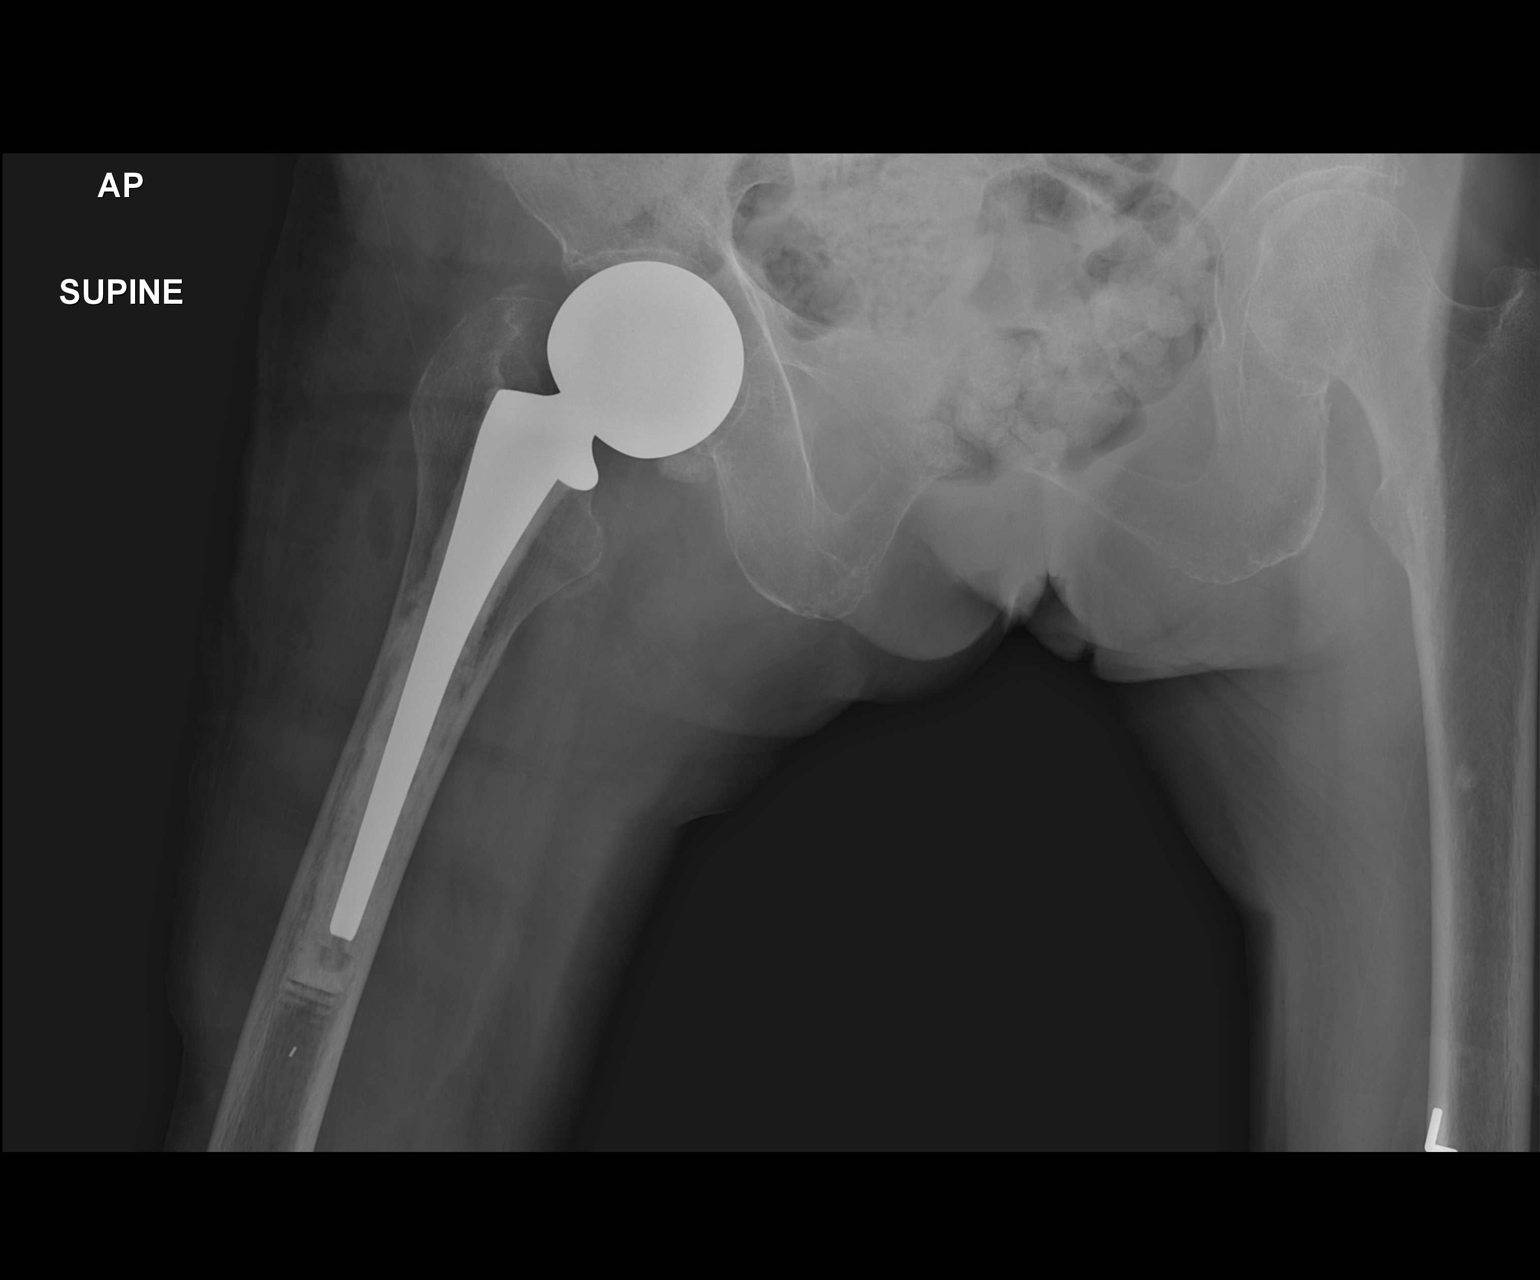

[1 of 1 positions shown; findings below may reference images not displayed]

FINDINGS: Right hip replacement with good anatomic alignment. No acute
abnormality. Diffuse osteopenia.
IMPRESSION: Right hip replacement with good anatomic alignment.

## 2014-12-13 IMAGING — CR DG HIP 1V PORT*R*
1 series · 1 of 1 positions shown · non-contrast
Comparison: None.

CLINICAL DATA: Right hip fracture.  Postop from hip arthroplasty.

EXAM:
PORTABLE RIGHT HIP - 1 VIEW

[AP]
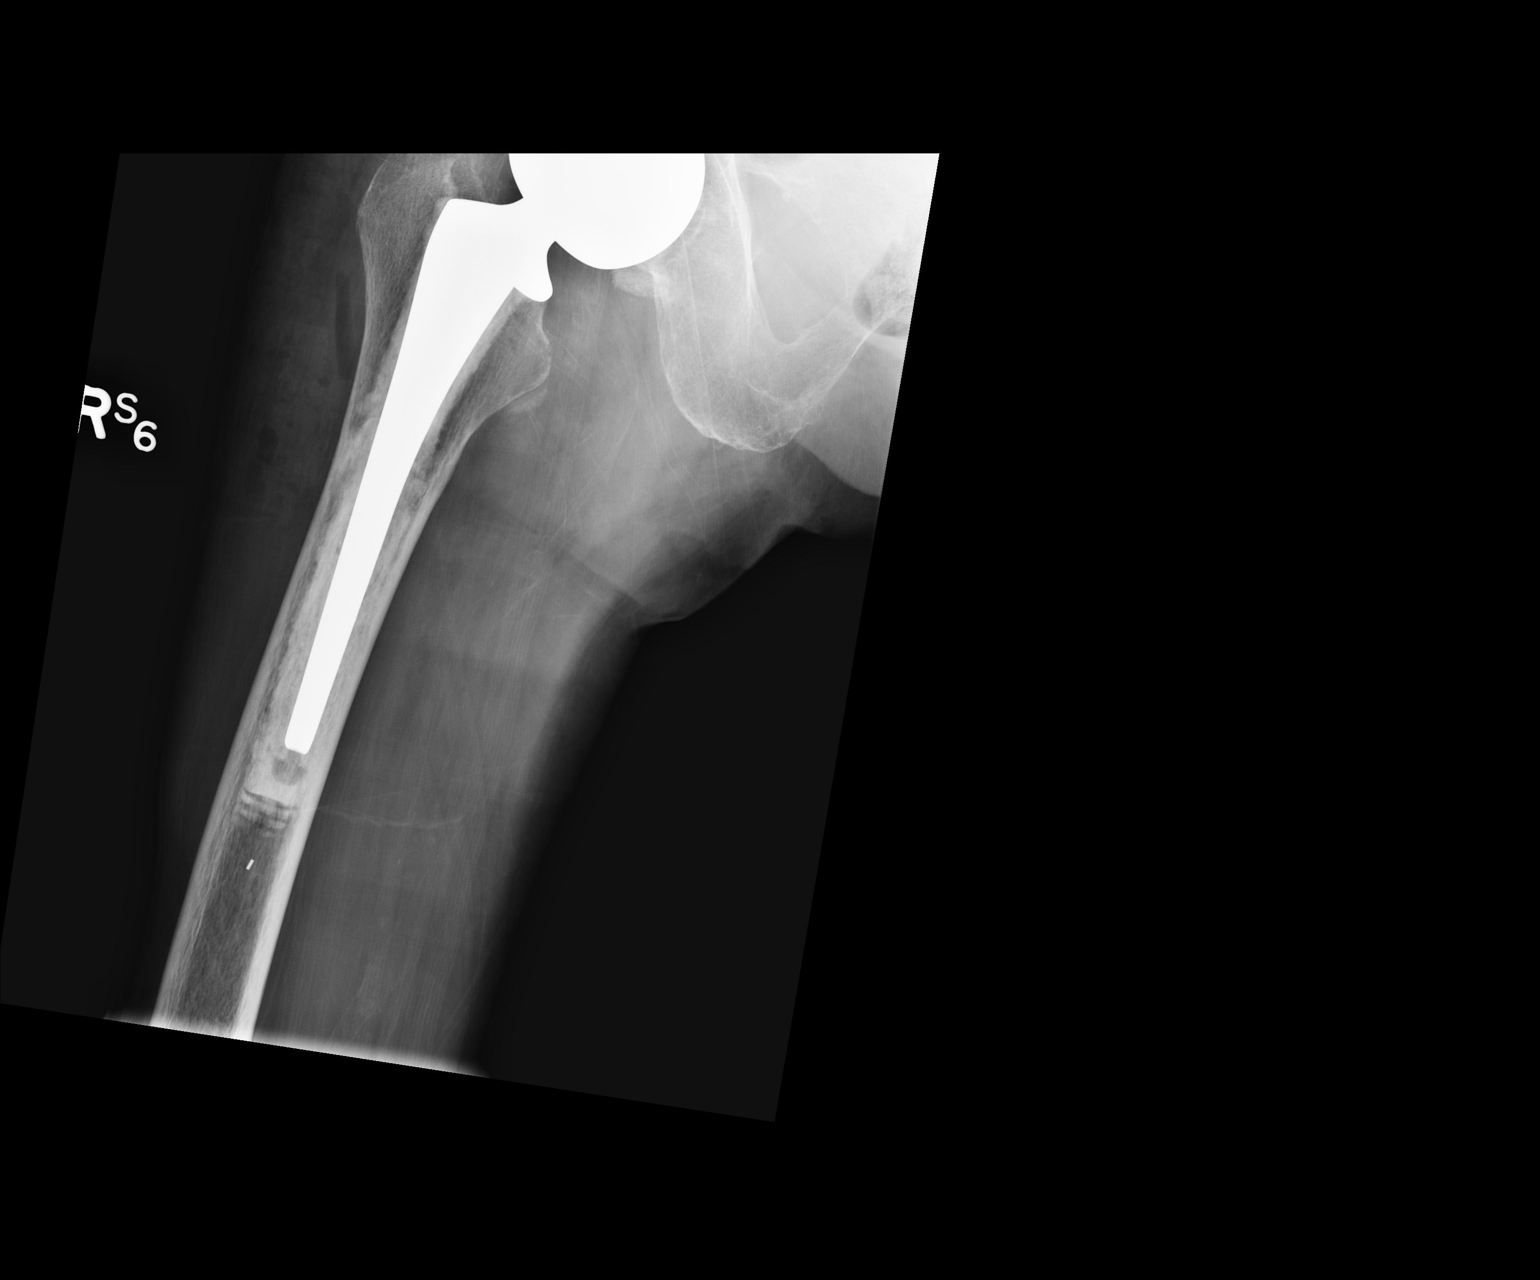

[1 of 1 positions shown; findings below may reference images not displayed]

FINDINGS: Right hip prosthesis is seen in expected position. No evidence of
fracture or dislocation.
IMPRESSION: Right hip prosthesis in appropriate position. No fracture or other
acute findings identified.
